# Patient Record
Sex: Male | Born: 1975
Health system: Southern US, Community
[De-identification: ages and names within clinical notes are randomized; demographics above are authoritative.]

## PROBLEM LIST (undated history)

## (undated) DIAGNOSIS — S83519A Sprain of anterior cruciate ligament of unspecified knee, initial encounter: Secondary | ICD-10-CM

## (undated) DIAGNOSIS — Z8701 Personal history of pneumonia (recurrent): Secondary | ICD-10-CM

## (undated) DIAGNOSIS — G4730 Sleep apnea, unspecified: Secondary | ICD-10-CM

## (undated) DIAGNOSIS — M1990 Unspecified osteoarthritis, unspecified site: Secondary | ICD-10-CM

## (undated) DIAGNOSIS — E785 Hyperlipidemia, unspecified: Secondary | ICD-10-CM

## (undated) DIAGNOSIS — I10 Essential (primary) hypertension: Secondary | ICD-10-CM

## (undated) HISTORY — PX: BACK SURGERY: SHX140

## (undated) HISTORY — DX: Hyperlipidemia, unspecified: E78.5

## (undated) HISTORY — PX: NO PAST SURGERIES: SHX2092

---

## 2014-12-25 ENCOUNTER — Ambulatory Visit: Payer: Self-pay | Admitting: Family

## 2014-12-25 VITALS — BP 190/110 | HR 92 | Temp 98.8°F

## 2014-12-25 DIAGNOSIS — I10 Essential (primary) hypertension: Secondary | ICD-10-CM

## 2014-12-25 DIAGNOSIS — R319 Hematuria, unspecified: Secondary | ICD-10-CM

## 2014-12-25 LAB — POCT URINALYSIS DIPSTICK
Glucose, UA: NEGATIVE
Nitrite, UA: NEGATIVE
PH UA: 6
Urobilinogen, UA: 4

## 2014-12-25 MED ORDER — LOSARTAN POTASSIUM 100 MG PO TABS: 100.0000 mg | ORAL_TABLET | Freq: Every day | ORAL | Status: DC

## 2014-12-25 NOTE — Progress Notes (Signed)
S/ new pt . C/o two episodes of painless hematuria one week apart, not associated with trauma , intercourse, or infective sxs. Denies flank pain, abd pain; is circumsised H/o dx HTN did not take med, sounds like could be HCTZ family history + , non smoker Denies cardiac or neuro sxs , looking for PCP and to loose wt ,   O/ alert pleasant NAD Heart rsr lungs clear abd morbidly obese no tenderness appreciated, extremities with  Edema  A/ HTN uncontrolled MORBID OBESITY   intermittant GROSS HEMATURIA PAINLESS    P/  COZARR 100 MG STARTED. IF HE HAS ACUTE SXS HE IS TO SEEK URGENT CARE.  RTC MONDAY FOR FASTING BASELINE LABS. AND BP RECHECK .LIFESTYLE CHANGES ENCOURAGED AND ESTABLISH PCP CARE asap  U/ A SENT. MAY REFER TO UROLOGY .if u/a + will send for U/C and rx . Recheck dip after anbx

## 2014-12-26 ENCOUNTER — Encounter: Payer: Self-pay | Admitting: Family

## 2014-12-26 ENCOUNTER — Other Ambulatory Visit: Payer: Self-pay | Admitting: Family

## 2014-12-26 LAB — URINALYSIS, ROUTINE W REFLEX MICROSCOPIC
BILIRUBIN UA: NEGATIVE
Glucose, UA: NEGATIVE
NITRITE UA: NEGATIVE
PH UA: 6 (ref 5.0–7.5)
Specific Gravity, UA: 1.025 (ref 1.005–1.030)
UUROB: 1 mg/dL (ref 0.2–1.0)

## 2014-12-26 LAB — MICROSCOPIC EXAMINATION: CASTS: NONE SEEN /LPF

## 2014-12-26 MED ORDER — CIPROFLOXACIN HCL 500 MG PO TABS: 500.0000 mg | ORAL_TABLET | Freq: Two times a day (BID) | ORAL | Status: DC

## 2014-12-26 NOTE — Progress Notes (Signed)
Patient ID: Brent Cole, male   DOB: 03/28/1975, 39 y.o.   MRN: [id number]  I spoke to the patient about his lab results and he expressed understanding.  Informed patient that Tommie has sent in a prescription for Cipro and she wants him to come back when he finishes the medication to recheck his urine.  I also referred the patient to [facility] [phone]) [place].  [place] preferred to see a doctor in [place].  His appointment is 02/10/2014 @ 9:30am.

## 2014-12-28 LAB — URINE CULTURE: ORGANISM ID, BACTERIA: NO GROWTH

## 2014-12-28 LAB — SPECIMEN STATUS REPORT

## 2014-12-29 ENCOUNTER — Ambulatory Visit: Payer: Self-pay | Admitting: Physician Assistant

## 2015-01-29 ENCOUNTER — Encounter: Payer: Self-pay | Admitting: Physician Assistant

## 2015-01-29 ENCOUNTER — Ambulatory Visit: Payer: Self-pay | Admitting: Family

## 2015-01-29 VITALS — BP 160/98 | HR 84 | Temp 97.1°F

## 2015-01-29 DIAGNOSIS — I10 Essential (primary) hypertension: Secondary | ICD-10-CM

## 2015-01-29 DIAGNOSIS — G510 Bell's palsy: Secondary | ICD-10-CM

## 2015-01-29 DIAGNOSIS — R319 Hematuria, unspecified: Secondary | ICD-10-CM

## 2015-01-29 MED ORDER — VALACYCLOVIR HCL 1 G PO TABS: 1000.0000 mg | ORAL_TABLET | Freq: Three times a day (TID) | ORAL | Status: DC

## 2015-01-29 MED ORDER — METHYLPREDNISOLONE 4 MG PO TBPK: ORAL_TABLET | ORAL | Status: DC

## 2015-01-29 NOTE — Progress Notes (Signed)
S / one day hx of Weakness of R face , no numbness or tingling, or neuro/cardio sxs .  No recent viral illness or tick bite. Lost to f/u for acute hematuria and  Mod/sev HTN treated and did not RTC for baseline labs, his UC was negative;cipro was started and completed because we could not get up with him to stop after neg UC . Marland Kitchen. He states he has had no further hematuria  . He is taking bP but not established care with PCP due to changes In insurance first of year . He would like an rx for the gym to help with gym fee.   O/ alert bp high but improved  From last reading NAD obvious facial asymetry  Can close eye with intention ,EOMS full, visual fields intact , Neck supple withou bruit heart rsr lungs clear, gait normal , strength and reflexes =   A/ bells palsy  R/o lyme , hiv   P / Baseline labs ,lyme and HIV  RTC tomorrow fasting. Also repeat U/ A dip  Rx s medrol dose pack 4 mg x 6 days with instructions. Valtrex 1,000 mg one tid x 7 days. #21  Recheck bp in am .

## 2015-01-30 ENCOUNTER — Other Ambulatory Visit: Payer: Self-pay

## 2015-01-30 VITALS — BP 160/100

## 2015-01-30 DIAGNOSIS — Z299 Encounter for prophylactic measures, unspecified: Secondary | ICD-10-CM

## 2015-01-30 LAB — POCT URINALYSIS DIPSTICK
Bilirubin, UA: NEGATIVE
GLUCOSE UA: NEGATIVE
KETONES UA: NEGATIVE
LEUKOCYTES UA: NEGATIVE
Nitrite, UA: NEGATIVE
PROTEIN UA: NEGATIVE
SPEC GRAV UA: 1.02
Urobilinogen, UA: 0.2
pH, UA: 5.5

## 2015-01-30 NOTE — Progress Notes (Signed)
Patient came in to have blood drawn per Tommie Ann's order.  Blood was drawn from the right hand by Greig RightSusan Fisher, PA without any incident.

## 2015-01-30 NOTE — Addendum Note (Signed)
Addended by: Catha BrowEACON, Melbert Botelho T on: 01/30/2015 01:35 PM   Modules accepted: Orders

## 2015-02-03 LAB — CMP12+LP+TP+TSH+6AC+PSA+CBC…
A/G RATIO: 1.1 (ref 1.1–2.5)
ALBUMIN: 4.2 g/dL (ref 3.5–5.5)
ALK PHOS: 76 IU/L (ref 39–117)
ALT: 24 IU/L (ref 0–44)
AST: 27 IU/L (ref 0–40)
BASOS ABS: 0 10*3/uL (ref 0.0–0.2)
BILIRUBIN TOTAL: 0.6 mg/dL (ref 0.0–1.2)
BUN / CREAT RATIO: 14 (ref 9–20)
BUN: 13 mg/dL (ref 6–24)
Basos: 0 %
CHLORIDE: 99 mmol/L (ref 96–106)
CHOLESTEROL TOTAL: 182 mg/dL (ref 100–199)
Calcium: 9 mg/dL (ref 8.7–10.2)
Chol/HDL Ratio: 4.7 ratio units (ref 0.0–5.0)
Creatinine, Ser: 0.9 mg/dL (ref 0.76–1.27)
EOS (ABSOLUTE): 0 10*3/uL (ref 0.0–0.4)
EOS: 0 %
Estimated CHD Risk: 0.9 times avg. (ref 0.0–1.0)
FREE THYROXINE INDEX: 1.7 (ref 1.2–4.9)
GFR calc non Af Amer: 106 mL/min/{1.73_m2} (ref >59)
GFR, EST AFRICAN AMERICAN: 122 mL/min/{1.73_m2} (ref >59)
GGT: 23 IU/L (ref 0–65)
GLOBULIN, TOTAL: 3.9 g/dL (ref 1.5–4.5)
Glucose: 89 mg/dL (ref 65–99)
HDL: 39 mg/dL — AB (ref >39)
HEMOGLOBIN: 13.8 g/dL (ref 12.6–17.7)
Hematocrit: 42.4 % (ref 37.5–51.0)
IMMATURE GRANULOCYTES: 0 %
Immature Grans (Abs): 0 10*3/uL (ref 0.0–0.1)
Iron: 114 ug/dL (ref 38–169)
LDH: 249 IU/L — ABNORMAL HIGH (ref 121–224)
LDL CALC: 135 mg/dL — AB (ref 0–99)
Lymphocytes Absolute: 1 10*3/uL (ref 0.7–3.1)
Lymphs: 26 %
MCH: 23.5 pg — ABNORMAL LOW (ref 26.6–33.0)
MCHC: 32.5 g/dL (ref 31.5–35.7)
MCV: 72 fL — ABNORMAL LOW (ref 79–97)
MONOCYTES: 4 %
Monocytes Absolute: 0.2 10*3/uL (ref 0.1–0.9)
NEUTROS PCT: 70 %
Neutrophils Absolute: 2.7 10*3/uL (ref 1.4–7.0)
PLATELETS: 244 10*3/uL (ref 150–379)
PROSTATE SPECIFIC AG, SERUM: 1 ng/mL (ref 0.0–4.0)
Phosphorus: 3.9 mg/dL (ref 2.5–4.5)
Potassium: 4.7 mmol/L (ref 3.5–5.2)
RBC: 5.88 x10E6/uL — AB (ref 4.14–5.80)
RDW: 16 % — AB (ref 12.3–15.4)
Sodium: 138 mmol/L (ref 134–144)
T3 UPTAKE RATIO: 32 % (ref 24–39)
T4, Total: 5.4 ug/dL (ref 4.5–12.0)
TRIGLYCERIDES: 41 mg/dL (ref 0–149)
TSH: 0.519 u[IU]/mL (ref 0.450–4.500)
Total Protein: 8.1 g/dL (ref 6.0–8.5)
Uric Acid: 6.7 mg/dL (ref 3.7–8.6)
VLDL CHOLESTEROL CAL: 8 mg/dL (ref 5–40)
WBC: 3.8 10*3/uL (ref 3.4–10.8)

## 2015-02-03 LAB — HIV ANTIBODY (ROUTINE TESTING W REFLEX): HIV SCREEN 4TH GENERATION: NONREACTIVE

## 2015-02-03 LAB — HGB A1C W/O EAG: HEMOGLOBIN A1C: 6.3 % — AB (ref 4.8–5.6)

## 2015-02-03 LAB — B. BURGDORFI ANTIBODIES: Lyme IgG/IgM Ab: <0.91 {ISR} (ref 0.00–0.90)

## 2015-02-05 ENCOUNTER — Encounter: Payer: Self-pay | Admitting: Emergency Medicine

## 2015-02-05 NOTE — Progress Notes (Signed)
Brent Era BumpersAnn Moore spoke with the patient about his lab results and I mailed a copy to the patient's address per his request.

## 2015-11-05 ENCOUNTER — Encounter (HOSPITAL_COMMUNITY): Payer: Self-pay | Admitting: Emergency Medicine

## 2015-11-05 ENCOUNTER — Ambulatory Visit (HOSPITAL_COMMUNITY)
Admission: EM | Admit: 2015-11-05 | Discharge: 2015-11-05 | Disposition: A | Discharge status: Home / Self Care | Payer: Commercial Managed Care - HMO | Attending: Emergency Medicine | Admitting: Emergency Medicine

## 2015-11-05 DIAGNOSIS — G510 Bell's palsy: Secondary | ICD-10-CM

## 2015-11-05 DIAGNOSIS — I159 Secondary hypertension, unspecified: Secondary | ICD-10-CM | POA: Diagnosis not present

## 2015-11-05 DIAGNOSIS — M5442 Lumbago with sciatica, left side: Secondary | ICD-10-CM

## 2015-11-05 MED ORDER — METAXALONE 800 MG PO TABS: 800.0000 mg | ORAL_TABLET | Freq: Three times a day (TID) | ORAL | 0 refills | Status: DC

## 2015-11-05 MED ORDER — METHYLPREDNISOLONE 4 MG PO TBPK: ORAL_TABLET | ORAL | 0 refills | Status: DC

## 2015-11-05 MED ORDER — HYDROCODONE-ACETAMINOPHEN 5-325 MG PO TABS: 2.0000 | ORAL_TABLET | ORAL | 0 refills | Status: DC | PRN

## 2015-11-05 MED ORDER — DICLOFENAC SODIUM 75 MG PO TBEC: 75.0000 mg | DELAYED_RELEASE_TABLET | Freq: Two times a day (BID) | ORAL | 0 refills | Status: DC

## 2015-11-05 MED ORDER — LOSARTAN POTASSIUM 25 MG PO TABS: 25.0000 mg | ORAL_TABLET | Freq: Every day | ORAL | 0 refills | Status: DC

## 2015-11-05 NOTE — ED Triage Notes (Signed)
Patient reports having low back pain that radiates into bilateral legs, particularly the left thigh.  Denies bowel or bladder issues
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
## 2015-11-05 NOTE — Discharge Instructions (Signed)
Decrease your salt intake. diet and exercise will lower your blood pressure significantly. It is important to keep your blood pressure under good control, as having a elevated for prolonged periods of time significantly increases your risk of stroke, heart attacks, kidney damage, eye damage, and other problems. Keep a log of your blood pressure. Measure it once a day at the same time every day.  Return immediately to the ER if you start having chest pain, headache, problems seeing, problems talking, problems walking, if you feel like you're about to pass out, if you do pass out, if you have a seizure, or for any other concerns..Marland Kitchen

## 2015-11-05 NOTE — ED Notes (Signed)
This nurse not notified of abnormal vital signs 

## 2015-11-06 ENCOUNTER — Telehealth (HOSPITAL_COMMUNITY): Payer: Self-pay | Admitting: Emergency Medicine

## 2015-11-06 MED ORDER — LOSARTAN POTASSIUM 50 MG PO TABS
50.0000 mg | ORAL_TABLET | Freq: Every day | ORAL | 0 refills | Status: DC
Start: 2015-11-06 — End: 2016-01-13

## 2015-11-06 NOTE — Telephone Encounter (Signed)
-----   Message from Domenick GongAshley Mortenson, MD sent at 11/06/2015  9:01 AM EDT ----- Martina SinnerHi, Blu Lori. I started this patient on 25 mg of losartan once daily last night. The correct dose is actually 50 mg by mouth once daily. Can please call the patient and let him know that he can increase his losartan up to 50 mg once daily. If he needs a new prescription, authorizing losartan with  50 mg, dispense 30, no refills, generic okay to the pharmacy of his choice. Please remind him to keep a log of his blood pressure. Thanks  Morrie SheldonAshley

## 2015-11-06 NOTE — Telephone Encounter (Signed)
Called pt and notified of new change to Rx given yest  States he has not p/u his Rx yet but will p/u later on this morning..... Adv pt that he will need to double up on his 25 mg dose to equal 50 mg and adv him that his new Rx will be for 50 mg 1 tab QD  Also adv pt to keep daily log of BP readings and to f/u PCP... If sx get worse, he is to [facility]  Pt verb understanding and wants me to call in Rx [facility]

## 2015-11-17 ENCOUNTER — Other Ambulatory Visit: Payer: Self-pay | Admitting: Internal Medicine

## 2015-11-17 DIAGNOSIS — M545 Low back pain, unspecified: Secondary | ICD-10-CM

## 2015-11-24 ENCOUNTER — Ambulatory Visit
Admission: RE | Admit: 2015-11-24 | Discharge: 2015-11-24 | Disposition: A | Discharge status: Home / Self Care | Payer: Commercial Managed Care - HMO | Source: Ambulatory Visit | Attending: Internal Medicine | Admitting: Internal Medicine

## 2015-11-24 DIAGNOSIS — M545 Low back pain, unspecified: Secondary | ICD-10-CM

## 2016-01-05 ENCOUNTER — Other Ambulatory Visit: Payer: Self-pay | Admitting: Neurosurgery

## 2016-01-08 HISTORY — PX: OTHER SURGICAL HISTORY: SHX169

## 2016-01-13 NOTE — Pre-Procedure Instructions (Signed)
Brent Cole  01/13/2016      [facility] [phone] [place] - [address] AT [facility] [address] [facility] [address] [place] [address] Phone: [phone] Fax: [phone]  [facility] [id number] [place] - [address] AT [facility] OF [address] [place] [id number] Phone: [phone] Fax: [phone]    Your procedure is scheduled on Tuesday, December 12th, 2017.  Report to [facility] Admitting at 5:30 A.M.   Call this number if you have problems the morning of surgery:  [phone]   Remember:  Do not eat food or drink liquids after midnight.   Take these medicines the morning of surgery with A SIP OF WATER: Acetaminophen (Tylenol) if needed, Hydrocodone-acetaminophen (Norco/Vicodin) if needed  Stop taking: Diclofenac (Voltaren), Aspirin, NSAIDS, Aleve, Naproxen, Ibuprofen, Advil, Motrin, BC's, Goody's, Fish oil, all herbal medications, and all vitamins.    Do not wear jewelry.  Do not wear lotions, powders, or colognes, or deoderant.  Men may shave face and neck.  Do not bring valuables to the hospital.  [facility] is not responsible for any belongings or valuables.  Contacts, dentures or bridgework may not be worn into surgery.  Leave your suitcase in the car.  After surgery it may be brought to your room.  For patients admitted to the hospital, discharge time will be determined by your treatment team.  Patients discharged the day of surgery will not be allowed to drive home.   Special instructions:  Preparing for Surgery.   [place]- Preparing For Surgery  Before surgery, you can play an important role. Because skin is not sterile, your skin needs to be as free of germs as possible. You can reduce the number of germs on your skin by washing with CHG (chlorahexidine gluconate) Soap before surgery.  CHG is an antiseptic cleaner which kills germs and bonds  with the skin to continue killing germs even after washing.  Please do not use if you have an allergy to CHG or antibacterial soaps. If your skin becomes reddened/irritated stop using the CHG.  Do not shave (including legs and underarms) for at least 48 hours prior to first CHG shower. It is OK to shave your face.  Please follow these instructions carefully.   1. Shower the NIGHT BEFORE SURGERY and the MORNING OF SURGERY with CHG.   2. If you chose to wash your hair, wash your hair first as usual with your normal shampoo.  3. After you shampoo, rinse your hair and body thoroughly to remove the shampoo.  4. Use CHG as you would any other liquid soap. You can apply CHG directly to the skin and wash gently with a scrungie or a clean washcloth.   5. Apply the CHG Soap to your body ONLY FROM THE NECK DOWN.  Do not use on open wounds or open sores. Avoid contact with your eyes, ears, mouth and genitals (private parts). Wash genitals (private parts) with your normal soap.  6. Wash thoroughly, paying special attention to the area where your surgery will be performed.  7. Thoroughly rinse your body with warm water from the neck down.  8. DO NOT shower/wash with your normal soap after using and rinsing off the CHG Soap.  9. Pat yourself dry with a CLEAN TOWEL.   10. Wear CLEAN PAJAMAS   11. Place CLEAN SHEETS on your bed the night of  your first shower and DO NOT SLEEP WITH PETS.   Day of Surgery: Do not apply any deodorants/lotions. Please wear clean clothes to the hospital/surgery center.     Please read over the following fact sheets that you were given. MRSA Information

## 2016-01-14 ENCOUNTER — Encounter (HOSPITAL_COMMUNITY)
Admission: RE | Admit: 2016-01-14 | Discharge: 2016-01-14 | Disposition: A | Discharge status: Home / Self Care | Payer: Commercial Managed Care - HMO | Source: Ambulatory Visit | Attending: Neurosurgery | Admitting: Neurosurgery

## 2016-01-14 ENCOUNTER — Encounter (HOSPITAL_COMMUNITY): Payer: Self-pay

## 2016-01-14 DIAGNOSIS — M1990 Unspecified osteoarthritis, unspecified site: Secondary | ICD-10-CM | POA: Insufficient documentation

## 2016-01-14 DIAGNOSIS — Z01810 Encounter for preprocedural cardiovascular examination: Secondary | ICD-10-CM | POA: Diagnosis present

## 2016-01-14 DIAGNOSIS — G4733 Obstructive sleep apnea (adult) (pediatric): Secondary | ICD-10-CM | POA: Diagnosis not present

## 2016-01-14 DIAGNOSIS — Z01812 Encounter for preprocedural laboratory examination: Secondary | ICD-10-CM | POA: Insufficient documentation

## 2016-01-14 DIAGNOSIS — I10 Essential (primary) hypertension: Secondary | ICD-10-CM | POA: Insufficient documentation

## 2016-01-14 DIAGNOSIS — Z6841 Body Mass Index (BMI) 40.0 and over, adult: Secondary | ICD-10-CM | POA: Insufficient documentation

## 2016-01-14 DIAGNOSIS — Z79899 Other long term (current) drug therapy: Secondary | ICD-10-CM | POA: Diagnosis not present

## 2016-01-14 HISTORY — DX: Essential (primary) hypertension: I10

## 2016-01-14 HISTORY — DX: Sprain of anterior cruciate ligament of unspecified knee, initial encounter: S83.519A

## 2016-01-14 HISTORY — DX: Unspecified osteoarthritis, unspecified site: M19.90

## 2016-01-14 HISTORY — DX: Personal history of pneumonia (recurrent): Z87.01

## 2016-01-14 HISTORY — DX: Sleep apnea, unspecified: G47.30

## 2016-01-14 LAB — BASIC METABOLIC PANEL
ANION GAP: 10 (ref 5–15)
BUN: 9 mg/dL (ref 6–20)
CALCIUM: 8.6 mg/dL — AB (ref 8.9–10.3)
CO2: 25 mmol/L (ref 22–32)
CREATININE: 1.18 mg/dL (ref 0.61–1.24)
Chloride: 98 mmol/L — ABNORMAL LOW (ref 101–111)
GFR calc Af Amer: >60 mL/min (ref >60)
GLUCOSE: 133 mg/dL — AB (ref 65–99)
Potassium: 3.6 mmol/L (ref 3.5–5.1)
Sodium: 133 mmol/L — ABNORMAL LOW (ref 135–145)

## 2016-01-14 LAB — ABO/RH: ABO/RH(D): B POS

## 2016-01-14 LAB — CBC
HCT: 41.6 % (ref 39.0–52.0)
Hemoglobin: 13.4 g/dL (ref 13.0–17.0)
MCH: 23.5 pg — ABNORMAL LOW (ref 26.0–34.0)
MCHC: 32.2 g/dL (ref 30.0–36.0)
MCV: 72.9 fL — AB (ref 78.0–100.0)
PLATELETS: 208 10*3/uL (ref 150–400)
RBC: 5.71 MIL/uL (ref 4.22–5.81)
RDW: 14.9 % (ref 11.5–15.5)
WBC: 4.3 10*3/uL (ref 4.0–10.5)

## 2016-01-14 LAB — TYPE AND SCREEN
ABO/RH(D): B POS
Antibody Screen: NEGATIVE

## 2016-01-14 LAB — SURGICAL PCR SCREEN
MRSA, PCR: NEGATIVE
STAPHYLOCOCCUS AUREUS: NEGATIVE

## 2016-01-14 NOTE — Progress Notes (Signed)
Patient called to inform nurse that he was being seen at Dr. Idelle CrouchPolite's office today for elevated blood pressure.

## 2016-01-14 NOTE — Progress Notes (Signed)
   01/14/16 1018  OBSTRUCTIVE SLEEP APNEA  Have you ever been diagnosed with sleep apnea through a sleep study? Yes (15 years ago had a study)  If yes, do you have and use a CPAP or BPAP machine every night? 0  Do you snore loudly (loud enough to be heard through closed doors)?  1  Do you often feel tired, fatigued, or sleepy during the daytime (such as falling asleep during driving or talking to someone)? 0  Has anyone observed you stop breathing during your sleep? 1  Do you have, or are you being treated for high blood pressure? 1  BMI more than 35 kg/m2? 1  Age > 50 (1-yes) 0  Neck circumference greater than:Male 16 inches or larger, Male 17inches or larger? 1  Male Gender (Yes=1) 1  Obstructive Sleep Apnea Score 6

## 2016-01-15 MED ORDER — DEXTROSE 5 % IV SOLN
3.0000 g | INTRAVENOUS | Status: AC
  Administered 2016-01-18 (×2): 3 g via INTRAVENOUS
  Filled 2016-01-15: qty 3000

## 2016-01-15 NOTE — Progress Notes (Signed)
Anesthesia Chart Review: Patient is a 40 year old male scheduled for L3-L5 laminectomy with L4-5 discectomy/fusion on 01/18/2016 by Dr. Jeral FruitBotero. Cased was moved up from 01/19/16 with Dr. Jeral FruitBotero to now 01/18/16 to Dr. Danielle DessElsner.   History includes non-smoker, HTN, arthritis, OSA (no CPAP). BMI (66) is consistent with super morbid obesity.   PCP is Dr. Renford Dillsonald Polite at [facility].  BP (!) 169/104   Pulse 83   Temp 36.4 C (Oral)   Resp 20   Ht 6' (1.829 m)   Wt (!) 485 lb 0.2 oz (220 kg)   SpO2 99%   BMI 65.78 kg/m  BP on arrival to PAT was 202/94, recheck 169/104. (Patient reported he had taken Hyzaar.)  Meds include Norco, losartan-HCTZ.   01/14/16 EKG: NSR.  Preoperative labs noted. Cr 1.18, H/H 13.4/41.6. T&S done.   Patient was notified that surgery could be cancelled if he arrived on the day of surgery with a significantly elevated BP. He notified Dr. Idelle CrouchPolite's office and was seen on 01/14/16 for HTN follow-up. Notes requested and will follow-up once received. I notified Jessica at Dr. Cassandria SanteeBotero's office of patient's elevated BP at PAT and that patient was seen yesterday [facility]. Hopefully additional antihypertensives were added.   Velna Ochsllison Taija Mathias, PA-C [facility] Short Stay Center/Anesthesiology Phone [phone] 01/15/2016 10:31 AM  Addendum: 01/14/16 office note from Dr. Georgann HousekeeperKarrar Husain received. BP 160/100 at that visit. Amlodipine 5 mg daily added. He was to have a nurse recheck his BP on 01/18/16, but now surgery is moved up. I called and spoke with him about taking amlodipine on the morning of surgery with sips of water. Hyzaar is a medication that we typically hold on the morning of surgery. His anesthesiologist can decide whether or not for him to take losartan-HCTZ after his arrival if his BP is still elevated. He is aware to arrive by 5:30 AM on Monday. Proceeding as planned will depend on BP readings and exam findings on the day of surgery.  Velna Ochsllison Kortlyn Koltz, PA-C [facility] Short Stay  Center/Anesthesiology Phone [phone] 01/15/2016 5:31 PM

## 2016-01-17 ENCOUNTER — Encounter (HOSPITAL_COMMUNITY): Payer: Self-pay | Admitting: Anesthesiology

## 2016-01-17 NOTE — Anesthesia Preprocedure Evaluation (Addendum)
Anesthesia Evaluation  Patient identified by MRN, date of birth, ID band Patient awake    Reviewed: Allergy & Precautions, NPO status , Patient's Chart, lab work & pertinent test results  Airway Mallampati: I  TM Distance: >3 FB Neck ROM: Full    Dental  (+) Teeth Intact, Dental Advisory Given   Pulmonary sleep apnea ,    breath sounds clear to auscultation       Cardiovascular hypertension, Pt. on medications  Rhythm:Regular Rate:Normal     Neuro/Psych negative neurological ROS  negative psych ROS   GI/Hepatic negative GI ROS, Neg liver ROS,   Endo/Other  negative endocrine ROS  Renal/GU negative Renal ROS  negative genitourinary   Musculoskeletal  (+) Arthritis , Osteoarthritis,    Abdominal   Peds  Hematology negative hematology ROS (+)   Anesthesia Other Findings   Reproductive/Obstetrics negative OB ROS                            Lab Results  Component Value Date   WBC 4.3 01/14/2016   HGB 13.4 01/14/2016   HCT 41.6 01/14/2016   MCV 72.9 (L) 01/14/2016   PLT 208 01/14/2016   Lab Results  Component Value Date   CREATININE 1.18 01/14/2016   BUN 9 01/14/2016   NA 133 (L) 01/14/2016   K 3.6 01/14/2016   CL 98 (L) 01/14/2016   CO2 25 01/14/2016   EKG: NSR  Anesthesia Physical Anesthesia Plan  ASA: III  Anesthesia Plan: General   Post-op Pain Management:    Induction: Intravenous  Airway Management Planned: Oral ETT  Additional Equipment:   Intra-op Plan:   Post-operative Plan: Extubation in OR  Informed Consent: I have reviewed the patients History and Physical, chart, labs and discussed the procedure including the risks, benefits and alternatives for the proposed anesthesia with the patient or authorized representative who has indicated his/her understanding and acceptance.   Dental advisory given  Plan Discussed with: CRNA  Anesthesia Plan Comments:         Anesthesia Quick Evaluation

## 2016-01-18 ENCOUNTER — Inpatient Hospital Stay (HOSPITAL_COMMUNITY): Payer: Commercial Managed Care - HMO

## 2016-01-18 ENCOUNTER — Inpatient Hospital Stay (HOSPITAL_COMMUNITY): Payer: Commercial Managed Care - HMO | Admitting: Anesthesiology

## 2016-01-18 ENCOUNTER — Encounter (HOSPITAL_COMMUNITY)
Admission: RE | Disposition: A | Discharge status: Home Health | Payer: Self-pay | Source: Ambulatory Visit | Attending: Neurological Surgery

## 2016-01-18 ENCOUNTER — Inpatient Hospital Stay (HOSPITAL_COMMUNITY)
Admission: RE | Admit: 2016-01-18 | Discharge: 2016-01-28 | DRG: 454 | Disposition: A | Discharge status: Home Health | Payer: Commercial Managed Care - HMO | Source: Ambulatory Visit | Attending: Neurological Surgery | Admitting: Neurological Surgery

## 2016-01-18 ENCOUNTER — Encounter (HOSPITAL_COMMUNITY): Payer: Self-pay

## 2016-01-18 ENCOUNTER — Inpatient Hospital Stay (HOSPITAL_COMMUNITY): Payer: Commercial Managed Care - HMO | Admitting: Vascular Surgery

## 2016-01-18 DIAGNOSIS — R112 Nausea with vomiting, unspecified: Secondary | ICD-10-CM

## 2016-01-18 DIAGNOSIS — M5116 Intervertebral disc disorders with radiculopathy, lumbar region: Secondary | ICD-10-CM | POA: Diagnosis present

## 2016-01-18 DIAGNOSIS — R739 Hyperglycemia, unspecified: Secondary | ICD-10-CM | POA: Diagnosis not present

## 2016-01-18 DIAGNOSIS — G4733 Obstructive sleep apnea (adult) (pediatric): Secondary | ICD-10-CM | POA: Diagnosis present

## 2016-01-18 DIAGNOSIS — M1990 Unspecified osteoarthritis, unspecified site: Secondary | ICD-10-CM | POA: Diagnosis present

## 2016-01-18 DIAGNOSIS — M48062 Spinal stenosis, lumbar region with neurogenic claudication: Principal | ICD-10-CM | POA: Diagnosis present

## 2016-01-18 DIAGNOSIS — I10 Essential (primary) hypertension: Secondary | ICD-10-CM | POA: Diagnosis present

## 2016-01-18 DIAGNOSIS — D62 Acute posthemorrhagic anemia: Secondary | ICD-10-CM | POA: Diagnosis not present

## 2016-01-18 DIAGNOSIS — R109 Unspecified abdominal pain: Secondary | ICD-10-CM

## 2016-01-18 DIAGNOSIS — N179 Acute kidney failure, unspecified: Secondary | ICD-10-CM | POA: Diagnosis not present

## 2016-01-18 DIAGNOSIS — K567 Ileus, unspecified: Secondary | ICD-10-CM | POA: Diagnosis not present

## 2016-01-18 DIAGNOSIS — Z8249 Family history of ischemic heart disease and other diseases of the circulatory system: Secondary | ICD-10-CM

## 2016-01-18 DIAGNOSIS — Z6841 Body Mass Index (BMI) 40.0 and over, adult: Secondary | ICD-10-CM

## 2016-01-18 DIAGNOSIS — M5117 Intervertebral disc disorders with radiculopathy, lumbosacral region: Secondary | ICD-10-CM | POA: Diagnosis present

## 2016-01-18 DIAGNOSIS — E662 Morbid (severe) obesity with alveolar hypoventilation: Secondary | ICD-10-CM | POA: Diagnosis present

## 2016-01-18 DIAGNOSIS — I959 Hypotension, unspecified: Secondary | ICD-10-CM

## 2016-01-18 DIAGNOSIS — T465X5A Adverse effect of other antihypertensive drugs, initial encounter: Secondary | ICD-10-CM | POA: Diagnosis not present

## 2016-01-18 DIAGNOSIS — Y92239 Unspecified place in hospital as the place of occurrence of the external cause: Secondary | ICD-10-CM | POA: Diagnosis not present

## 2016-01-18 DIAGNOSIS — T502X5A Adverse effect of carbonic-anhydrase inhibitors, benzothiadiazides and other diuretics, initial encounter: Secondary | ICD-10-CM | POA: Diagnosis not present

## 2016-01-18 DIAGNOSIS — Z9889 Other specified postprocedural states: Secondary | ICD-10-CM

## 2016-01-18 DIAGNOSIS — M4316 Spondylolisthesis, lumbar region: Secondary | ICD-10-CM | POA: Diagnosis present

## 2016-01-18 DIAGNOSIS — Z419 Encounter for procedure for purposes other than remedying health state, unspecified: Secondary | ICD-10-CM

## 2016-01-18 DIAGNOSIS — I9589 Other hypotension: Secondary | ICD-10-CM | POA: Diagnosis not present

## 2016-01-18 SURGERY — POSTERIOR LUMBAR FUSION 1 LEVEL
Anesthesia: General | Site: Back

## 2016-01-18 MED ORDER — HYDROMORPHONE HCL 1 MG/ML IJ SOLN
INTRAMUSCULAR | Status: AC
  Filled 2016-01-18: qty 1

## 2016-01-18 MED ORDER — SENNA 8.6 MG PO TABS
1.0000 | ORAL_TABLET | Freq: Two times a day (BID) | ORAL | Status: DC
  Administered 2016-01-18 – 2016-01-21 (×4): 8.6 mg via ORAL
  Filled 2016-01-18 (×13): qty 1

## 2016-01-18 MED ORDER — THROMBIN 5000 UNITS EX SOLR
OROMUCOSAL | Status: DC | PRN
  Administered 2016-01-18 (×2): 5 mL via TOPICAL

## 2016-01-18 MED ORDER — PHENOL 1.4 % MT LIQD: 1.0000 | OROMUCOSAL | Status: DC | PRN

## 2016-01-18 MED ORDER — CEFAZOLIN IN D5W 1 GM/50ML IV SOLN
1.0000 g | Freq: Three times a day (TID) | INTRAVENOUS | Status: AC
  Administered 2016-01-18 – 2016-01-19 (×2): 1 g via INTRAVENOUS
  Filled 2016-01-18 (×2): qty 50

## 2016-01-18 MED ORDER — DOCUSATE SODIUM 100 MG PO CAPS
100.0000 mg | ORAL_CAPSULE | Freq: Two times a day (BID) | ORAL | Status: DC
  Administered 2016-01-18 – 2016-01-25 (×11): 100 mg via ORAL
  Filled 2016-01-18 (×15): qty 1

## 2016-01-18 MED ORDER — ONDANSETRON HCL 4 MG/2ML IJ SOLN
INTRAMUSCULAR | Status: DC | PRN
  Administered 2016-01-18: 4 mg via INTRAVENOUS

## 2016-01-18 MED ORDER — SUGAMMADEX SODIUM 500 MG/5ML IV SOLN
INTRAVENOUS | Status: DC | PRN
  Administered 2016-01-18: 500 mg via INTRAVENOUS

## 2016-01-18 MED ORDER — SUCCINYLCHOLINE CHLORIDE 200 MG/10ML IV SOSY
PREFILLED_SYRINGE | INTRAVENOUS | Status: AC
  Filled 2016-01-18: qty 10

## 2016-01-18 MED ORDER — HYDROMORPHONE HCL 1 MG/ML IJ SOLN
0.2500 mg | INTRAMUSCULAR | Status: DC | PRN
  Administered 2016-01-18: 1 mg via INTRAVENOUS

## 2016-01-18 MED ORDER — PHENYLEPHRINE 40 MCG/ML (10ML) SYRINGE FOR IV PUSH (FOR BLOOD PRESSURE SUPPORT)
PREFILLED_SYRINGE | INTRAVENOUS | Status: AC
  Filled 2016-01-18: qty 10

## 2016-01-18 MED ORDER — MENTHOL 3 MG MT LOZG: 1.0000 | LOZENGE | OROMUCOSAL | Status: DC | PRN

## 2016-01-18 MED ORDER — CHLORHEXIDINE GLUCONATE CLOTH 2 % EX PADS: 6.0000 | MEDICATED_PAD | Freq: Once | CUTANEOUS | Status: DC

## 2016-01-18 MED ORDER — AMLODIPINE BESYLATE 5 MG PO TABS
5.0000 mg | ORAL_TABLET | Freq: Every day | ORAL | Status: DC
  Administered 2016-01-19 – 2016-01-25 (×7): 5 mg via ORAL
  Filled 2016-01-18 (×7): qty 1

## 2016-01-18 MED ORDER — LIDOCAINE HCL (CARDIAC) 20 MG/ML IV SOLN
INTRAVENOUS | Status: DC | PRN
  Administered 2016-01-18: 60 mg via INTRAVENOUS

## 2016-01-18 MED ORDER — THROMBIN 5000 UNITS EX SOLR
CUTANEOUS | Status: AC
  Filled 2016-01-18: qty 5000

## 2016-01-18 MED ORDER — ROCURONIUM BROMIDE 50 MG/5ML IV SOSY
PREFILLED_SYRINGE | INTRAVENOUS | Status: AC
  Filled 2016-01-18: qty 5

## 2016-01-18 MED ORDER — DEXAMETHASONE SODIUM PHOSPHATE 10 MG/ML IJ SOLN
INTRAMUSCULAR | Status: DC | PRN
  Administered 2016-01-18: 10 mg via INTRAVENOUS

## 2016-01-18 MED ORDER — MIDAZOLAM HCL 2 MG/2ML IJ SOLN
INTRAMUSCULAR | Status: AC
  Filled 2016-01-18: qty 2

## 2016-01-18 MED ORDER — EPHEDRINE SULFATE 50 MG/ML IJ SOLN
INTRAMUSCULAR | Status: DC | PRN
  Administered 2016-01-18 (×3): 5 mg via INTRAVENOUS

## 2016-01-18 MED ORDER — HYDROCODONE-ACETAMINOPHEN 10-325 MG PO TABS
1.0000 | ORAL_TABLET | Freq: Three times a day (TID) | ORAL | Status: DC | PRN
  Administered 2016-01-19 – 2016-01-25 (×3): 1 via ORAL
  Filled 2016-01-18 (×3): qty 1

## 2016-01-18 MED ORDER — LACTATED RINGERS IV SOLN: INTRAVENOUS | Status: DC

## 2016-01-18 MED ORDER — FENTANYL CITRATE (PF) 100 MCG/2ML IJ SOLN
INTRAMUSCULAR | Status: AC
  Filled 2016-01-18: qty 2

## 2016-01-18 MED ORDER — SODIUM CHLORIDE 0.9 % IV SOLN
INTRAVENOUS | Status: DC
  Administered 2016-01-20 – 2016-01-21 (×2): via INTRAVENOUS

## 2016-01-18 MED ORDER — EPHEDRINE 5 MG/ML INJ
INTRAVENOUS | Status: AC
  Filled 2016-01-18: qty 10

## 2016-01-18 MED ORDER — SODIUM CHLORIDE 0.9% FLUSH
3.0000 mL | Freq: Two times a day (BID) | INTRAVENOUS | Status: DC
  Administered 2016-01-18 – 2016-01-24 (×10): 3 mL via INTRAVENOUS

## 2016-01-18 MED ORDER — SODIUM CHLORIDE 0.9% FLUSH: 3.0000 mL | INTRAVENOUS | Status: DC | PRN

## 2016-01-18 MED ORDER — PROMETHAZINE HCL 25 MG/ML IJ SOLN: 6.2500 mg | INTRAMUSCULAR | Status: DC | PRN

## 2016-01-18 MED ORDER — PROPOFOL 10 MG/ML IV BOLUS
INTRAVENOUS | Status: DC | PRN
  Administered 2016-01-18: 100 mg via INTRAVENOUS
  Administered 2016-01-18: 200 mg via INTRAVENOUS

## 2016-01-18 MED ORDER — LOSARTAN POTASSIUM-HCTZ 100-25 MG PO TABS: 1.0000 | ORAL_TABLET | Freq: Every day | ORAL | Status: DC

## 2016-01-18 MED ORDER — PROPOFOL 10 MG/ML IV BOLUS
INTRAVENOUS | Status: AC
  Filled 2016-01-18: qty 40

## 2016-01-18 MED ORDER — ACETAMINOPHEN 325 MG PO TABS: 650.0000 mg | ORAL_TABLET | ORAL | Status: DC | PRN

## 2016-01-18 MED ORDER — LOSARTAN POTASSIUM 50 MG PO TABS
100.0000 mg | ORAL_TABLET | Freq: Every day | ORAL | Status: DC
  Administered 2016-01-18 – 2016-01-25 (×8): 100 mg via ORAL
  Filled 2016-01-18 (×8): qty 2

## 2016-01-18 MED ORDER — 0.9 % SODIUM CHLORIDE (POUR BTL) OPTIME
TOPICAL | Status: DC | PRN
  Administered 2016-01-18: 1000 mL

## 2016-01-18 MED ORDER — ALUM & MAG HYDROXIDE-SIMETH 200-200-20 MG/5ML PO SUSP
30.0000 mL | Freq: Four times a day (QID) | ORAL | Status: DC | PRN
Start: 2016-01-18 — End: 2016-01-28
  Administered 2016-01-20: 30 mL via ORAL
  Filled 2016-01-18 (×2): qty 30

## 2016-01-18 MED ORDER — LACTATED RINGERS IV SOLN
INTRAVENOUS | Status: DC | PRN
  Administered 2016-01-18 (×3): via INTRAVENOUS

## 2016-01-18 MED ORDER — BACITRACIN 50000 UNITS IM SOLR
INTRAMUSCULAR | Status: DC | PRN
  Administered 2016-01-18: 500 mL

## 2016-01-18 MED ORDER — MEPERIDINE HCL 25 MG/ML IJ SOLN: 6.2500 mg | INTRAMUSCULAR | Status: DC | PRN

## 2016-01-18 MED ORDER — PHENYLEPHRINE HCL 10 MG/ML IJ SOLN
INTRAMUSCULAR | Status: DC | PRN
  Administered 2016-01-18: 80 ug via INTRAVENOUS
  Administered 2016-01-18 (×2): 120 ug via INTRAVENOUS

## 2016-01-18 MED ORDER — LIDOCAINE-EPINEPHRINE (PF) 2 %-1:200000 IJ SOLN
INTRAMUSCULAR | Status: AC
  Filled 2016-01-18: qty 20

## 2016-01-18 MED ORDER — ONDANSETRON HCL 4 MG/2ML IJ SOLN
4.0000 mg | INTRAMUSCULAR | Status: DC | PRN
  Administered 2016-01-20 (×2): 4 mg via INTRAVENOUS
  Filled 2016-01-18 (×2): qty 2

## 2016-01-18 MED ORDER — ROCURONIUM BROMIDE 100 MG/10ML IV SOLN
INTRAVENOUS | Status: DC | PRN
  Administered 2016-01-18 (×2): 10 mg via INTRAVENOUS
  Administered 2016-01-18: 50 mg via INTRAVENOUS
  Administered 2016-01-18: 30 mg via INTRAVENOUS
  Administered 2016-01-18: 10 mg via INTRAVENOUS

## 2016-01-18 MED ORDER — OXYCODONE-ACETAMINOPHEN 5-325 MG PO TABS
1.0000 | ORAL_TABLET | ORAL | Status: DC | PRN
  Administered 2016-01-19 – 2016-01-27 (×5): 2 via ORAL
  Filled 2016-01-18 (×5): qty 2

## 2016-01-18 MED ORDER — PHENYLEPHRINE HCL 10 MG/ML IJ SOLN
INTRAVENOUS | Status: DC | PRN
  Administered 2016-01-18: 30 ug/min via INTRAVENOUS

## 2016-01-18 MED ORDER — THROMBIN 20000 UNITS EX SOLR
CUTANEOUS | Status: AC
  Filled 2016-01-18: qty 20000

## 2016-01-18 MED ORDER — THROMBIN 20000 UNITS EX SOLR
CUTANEOUS | Status: DC | PRN
  Administered 2016-01-18: 20 mL via TOPICAL

## 2016-01-18 MED ORDER — FENTANYL CITRATE (PF) 100 MCG/2ML IJ SOLN
INTRAMUSCULAR | Status: DC | PRN
  Administered 2016-01-18 (×3): 50 ug via INTRAVENOUS
  Administered 2016-01-18 (×2): 25 ug via INTRAVENOUS
  Administered 2016-01-18: 100 ug via INTRAVENOUS
  Administered 2016-01-18 (×2): 50 ug via INTRAVENOUS

## 2016-01-18 MED ORDER — BUPIVACAINE HCL (PF) 0.5 % IJ SOLN
INTRAMUSCULAR | Status: DC | PRN
  Administered 2016-01-18: 30 mL

## 2016-01-18 MED ORDER — METHOCARBAMOL 500 MG PO TABS
500.0000 mg | ORAL_TABLET | Freq: Four times a day (QID) | ORAL | Status: DC | PRN
  Administered 2016-01-19 – 2016-01-27 (×3): 500 mg via ORAL
  Filled 2016-01-18 (×3): qty 1

## 2016-01-18 MED ORDER — POLYETHYLENE GLYCOL 3350 17 G PO PACK: 17.0000 g | PACK | Freq: Every day | ORAL | Status: DC | PRN

## 2016-01-18 MED ORDER — BUPIVACAINE HCL (PF) 0.5 % IJ SOLN
INTRAMUSCULAR | Status: AC
  Filled 2016-01-18: qty 30

## 2016-01-18 MED ORDER — SUCCINYLCHOLINE CHLORIDE 20 MG/ML IJ SOLN
INTRAMUSCULAR | Status: DC | PRN
  Administered 2016-01-18: 130 mg via INTRAVENOUS

## 2016-01-18 MED ORDER — HYDROCHLOROTHIAZIDE 25 MG PO TABS
25.0000 mg | ORAL_TABLET | Freq: Every day | ORAL | Status: DC
  Administered 2016-01-18 – 2016-01-25 (×7): 25 mg via ORAL
  Filled 2016-01-18 (×8): qty 1

## 2016-01-18 MED ORDER — ALBUMIN HUMAN 5 % IV SOLN
INTRAVENOUS | Status: DC | PRN
  Administered 2016-01-18 (×2): via INTRAVENOUS

## 2016-01-18 MED ORDER — ACETAMINOPHEN 650 MG RE SUPP: 650.0000 mg | RECTAL | Status: DC | PRN

## 2016-01-18 MED ORDER — METHOCARBAMOL 1000 MG/10ML IJ SOLN
500.0000 mg | Freq: Four times a day (QID) | INTRAVENOUS | Status: DC | PRN
  Filled 2016-01-18: qty 5

## 2016-01-18 MED ORDER — HYDROMORPHONE HCL 1 MG/ML IJ SOLN
0.5000 mg | INTRAMUSCULAR | Status: DC | PRN
  Administered 2016-01-19 (×2): 1 mg via INTRAVENOUS
  Filled 2016-01-18 (×2): qty 1

## 2016-01-18 MED ORDER — LIDOCAINE-EPINEPHRINE (PF) 2 %-1:200000 IJ SOLN: INTRAMUSCULAR | Status: DC | PRN

## 2016-01-18 MED ORDER — ONDANSETRON HCL 4 MG/2ML IJ SOLN
INTRAMUSCULAR | Status: AC
  Filled 2016-01-18: qty 2

## 2016-01-18 MED ORDER — DEXTROSE 5 % IV SOLN
3.0000 g | INTRAVENOUS | Status: DC
  Filled 2016-01-18: qty 3000

## 2016-01-18 MED ORDER — SODIUM CHLORIDE 0.9 % IV SOLN: 250.0000 mL | INTRAVENOUS | Status: DC

## 2016-01-18 MED ORDER — BISACODYL 10 MG RE SUPP: 10.0000 mg | Freq: Every day | RECTAL | Status: DC | PRN

## 2016-01-18 MED ORDER — MIDAZOLAM HCL 5 MG/5ML IJ SOLN
INTRAMUSCULAR | Status: DC | PRN
  Administered 2016-01-18: 2 mg via INTRAVENOUS

## 2016-01-18 MED ORDER — LIDOCAINE 2% (20 MG/ML) 5 ML SYRINGE
INTRAMUSCULAR | Status: AC
  Filled 2016-01-18: qty 5

## 2016-01-18 SURGICAL SUPPLY — 75 items
BAG DECANTER FOR FLEXI CONT (MISCELLANEOUS) ×2 IMPLANT
BASKET BONE COLLECTION (BASKET) ×2 IMPLANT
BLADE CLIPPER SURG (BLADE) IMPLANT
BUR MATCHSTICK NEURO 3.0 LAGG (BURR) ×2 IMPLANT
CAGE COROENT LRG 8X9X28M SPINE (Cage) ×4 IMPLANT
CAGE COROENT LRG MP 8X9X28-12 (Cage) ×4 IMPLANT
CANISTER SUCT 3000ML PPV (MISCELLANEOUS) ×2 IMPLANT
CARTRIDGE OIL MAESTRO DRILL (MISCELLANEOUS) ×1 IMPLANT
CONNECTOR RELINE 45-65 5.5 (Connector) ×2 IMPLANT
CONT SPEC 4OZ CLIKSEAL STRL BL (MISCELLANEOUS) ×2 IMPLANT
COVER BACK TABLE 60X90IN (DRAPES) ×2 IMPLANT
DECANTER SPIKE VIAL GLASS SM (MISCELLANEOUS) ×2 IMPLANT
DERMABOND ADVANCED (GAUZE/BANDAGES/DRESSINGS) ×1
DERMABOND ADVANCED .7 DNX12 (GAUZE/BANDAGES/DRESSINGS) ×1 IMPLANT
DEVICE DISSECT PLASMABLAD 3.0S (MISCELLANEOUS) ×1 IMPLANT
DIFFUSER DRILL AIR PNEUMATIC (MISCELLANEOUS) ×2 IMPLANT
DRAPE C-ARM 42X72 X-RAY (DRAPES) ×6 IMPLANT
DRAPE HALF SHEET 40X57 (DRAPES) IMPLANT
DRAPE LAPAROTOMY 100X72X124 (DRAPES) ×2 IMPLANT
DRAPE POUCH INSTRU U-SHP 10X18 (DRAPES) ×2 IMPLANT
DRSG OPSITE POSTOP 4X10 (GAUZE/BANDAGES/DRESSINGS) ×2 IMPLANT
DURAPREP 26ML APPLICATOR (WOUND CARE) ×2 IMPLANT
DURASEAL APPLICATOR TIP (TIP) IMPLANT
DURASEAL SPINE SEALANT 3ML (MISCELLANEOUS) IMPLANT
ELECT REM PT RETURN 9FT ADLT (ELECTROSURGICAL) ×2
ELECTRODE REM PT RTRN 9FT ADLT (ELECTROSURGICAL) ×1 IMPLANT
GAUZE SPONGE 4X4 12PLY STRL (GAUZE/BANDAGES/DRESSINGS) ×2 IMPLANT
GAUZE SPONGE 4X4 16PLY XRAY LF (GAUZE/BANDAGES/DRESSINGS) IMPLANT
GLOVE BIO SURGEON STRL SZ 6.5 (GLOVE) ×10 IMPLANT
GLOVE BIOGEL M 8.0 STRL (GLOVE) ×2 IMPLANT
GLOVE BIOGEL PI IND STRL 6.5 (GLOVE) ×4 IMPLANT
GLOVE BIOGEL PI IND STRL 7.5 (GLOVE) ×2 IMPLANT
GLOVE BIOGEL PI IND STRL 8.5 (GLOVE) ×3 IMPLANT
GLOVE BIOGEL PI INDICATOR 6.5 (GLOVE) ×4
GLOVE BIOGEL PI INDICATOR 7.5 (GLOVE) ×2
GLOVE BIOGEL PI INDICATOR 8.5 (GLOVE) ×3
GLOVE ECLIPSE 7.5 STRL STRAW (GLOVE) ×4 IMPLANT
GLOVE ECLIPSE 8.5 STRL (GLOVE) ×8 IMPLANT
GLOVE ECLIPSE 9.0 STRL (GLOVE) ×2 IMPLANT
GOWN STRL REUS W/ TWL LRG LVL3 (GOWN DISPOSABLE) ×3 IMPLANT
GOWN STRL REUS W/ TWL XL LVL3 (GOWN DISPOSABLE) ×2 IMPLANT
GOWN STRL REUS W/TWL 2XL LVL3 (GOWN DISPOSABLE) ×4 IMPLANT
GOWN STRL REUS W/TWL LRG LVL3 (GOWN DISPOSABLE) ×3
GOWN STRL REUS W/TWL XL LVL3 (GOWN DISPOSABLE) ×2
HEMOSTAT POWDER KIT SURGIFOAM (HEMOSTASIS) ×2 IMPLANT
KIT BASIN OR (CUSTOM PROCEDURE TRAY) ×2 IMPLANT
KIT ROOM TURNOVER OR (KITS) ×2 IMPLANT
MODULE POWER NUVASIVE (MISCELLANEOUS) ×1 IMPLANT
NEEDLE HYPO 18GX1.5 BLUNT FILL (NEEDLE) ×2 IMPLANT
NEEDLE HYPO 22GX1.5 SAFETY (NEEDLE) ×2 IMPLANT
NS IRRIG 1000ML POUR BTL (IV SOLUTION) ×2 IMPLANT
OIL CARTRIDGE MAESTRO DRILL (MISCELLANEOUS) ×2
PACK LAMINECTOMY NEURO (CUSTOM PROCEDURE TRAY) ×2 IMPLANT
PAD ARMBOARD 7.5X6 YLW CONV (MISCELLANEOUS) ×6 IMPLANT
PATTIES SURGICAL .5 X1 (DISPOSABLE) ×2 IMPLANT
PATTIES SURGICAL 1X1 (DISPOSABLE) ×2 IMPLANT
PLASMABLADE 3.0S (MISCELLANEOUS) ×2
POWER MODULE NUVASIVE (MISCELLANEOUS) ×2
ROD RELINE TI LORD 5.5X70 (Rod) ×4 IMPLANT
SCREW LOCK RELINE 5.5 TULIP (Screw) ×12 IMPLANT
SCREW RELINE-O POLY 6.5X50MM (Screw) ×12 IMPLANT
SPONGE LAP 4X18 X RAY DECT (DISPOSABLE) ×6 IMPLANT
SPONGE NEURO XRAY DETECT 1X3 (DISPOSABLE) ×2 IMPLANT
SPONGE SURGIFOAM ABS GEL 100 (HEMOSTASIS) ×2 IMPLANT
SUT PROLENE 6 0 BV (SUTURE) IMPLANT
SUT VIC AB 1 CT1 18XBRD ANBCTR (SUTURE) ×1 IMPLANT
SUT VIC AB 1 CT1 8-18 (SUTURE) ×1
SUT VIC AB 2-0 CP2 18 (SUTURE) ×2 IMPLANT
SUT VIC AB 3-0 SH 8-18 (SUTURE) ×6 IMPLANT
SYR 3ML LL SCALE MARK (SYRINGE) ×8 IMPLANT
SYR 5ML LL (SYRINGE) ×2 IMPLANT
TOWEL OR 17X24 6PK STRL BLUE (TOWEL DISPOSABLE) ×2 IMPLANT
TOWEL OR 17X26 10 PK STRL BLUE (TOWEL DISPOSABLE) ×2 IMPLANT
TRAY FOLEY W/METER SILVER 16FR (SET/KITS/TRAYS/PACK) ×2 IMPLANT
WATER STERILE IRR 1000ML POUR (IV SOLUTION) ×2 IMPLANT

## 2016-01-18 NOTE — Anesthesia Postprocedure Evaluation (Signed)
Anesthesia Post Note  Patient: Brent Cole  Procedure(s) Performed: Procedure(s) (LRB): LUMBAR FOUR  TO SACRUM ONE LAMINECTOMY WITH LUMBAR FOUR - SACRUM ONE DISCECTOMY /FUSION WITH PEDICLE SCREWS (N/A)  Patient location during evaluation: PACU Anesthesia Type: General Level of consciousness: awake and alert Pain management: pain level controlled Vital Signs Assessment: post-procedure vital signs reviewed and stable Respiratory status: spontaneous breathing, nonlabored ventilation, respiratory function stable and patient connected to nasal cannula oxygen Cardiovascular status: blood pressure returned to baseline and stable Postop Assessment: no signs of nausea or vomiting Anesthetic complications: no    Last Vitals:  Vitals:   01/18/16 1700 01/18/16 1800  BP: (!) 150/100 (!) 150/92  Pulse: 96 87  Resp: 16 17  Temp:      Last Pain:  Vitals:   01/18/16 1700  TempSrc:   PainSc: 3     LLE Motor Response: Purposeful movement (01/18/16 1800) LLE Sensation: Full sensation (01/18/16 1800) RLE Motor Response: Purposeful movement (01/18/16 1800) RLE Sensation: Full sensation (01/18/16 1800)      Shelton SilvasKevin D Leandre Wien

## 2016-01-18 NOTE — Op Note (Signed)
Date of surgery: 01/18/2016 Preoperative diagnosis: Spondylolisthesis L4-L5 with herniated nucleus pulposus L4-5 L5-S1 lumbar radiculopathy lumbar stenosis. Postoperative diagnosis: Spondylolisthesis L4-L5 with herniated nucleus pulposus L4-5 and L5-S1 lumbar radiculopathy, lumbar stenosis. Procedure: Bilateral laminectomy decompression L4-5 L5-S1 posterior lumbar interbody arthrodesis L4-5 L5-S1 with segmental fixation L4 to sacrum. Local autograft. Posterior lateral arthrodesis with local autograft L4 to sacrum. Surgeon: Barnett AbuHenry Franchesca Veneziano First assistant: Lelon PerlaHenry Poole M.D. Anesthesia: Gen. endotracheal Indications: Brent Cooperntoine Cole 40 year old individual's had significant back and bilateral lower extremity pain and weakness has evidence of large centrally herniated disc at L4-L5 with grade 1 spondylolisthesis at that level addition he has a centrally herniated nucleus pulposus at L5-S1 with severe lumbar spinal stenosis. Is advised regarding the need for surgery to decompress and stabilize his spine from L4 to the sacrum.  Procedure: The patient was brought to the operating room supine on a stretcher. After the smooth induction of general endotracheal anesthesia, he was carefully turned prone onto an operative table with a large bolsters being used to provide padding for this morbidly obese individual. The back was prepped with alcohol DuraPrep and draped in a sterile fashion then a midline incision was created in the lower lumbar spine carried down to the lumbar dorsal fascia. The fascia was opened on either side of midline in the first spinous process was noted to be that of L4 isolated positively on the radiograph. Then the dissection was carried in the subperiosteal fashion between L4-5 L5-S1 to expose the lamina and the facet joints at L4-5 and L5-S1. Self-retaining retractor was placed deep into the wound and ultimately the soft tissues were dissected from the interlaminar space at L4-5 and L5-S1 and the  transverse process of L4 was identified out to the facet inferiorly at L3-L4. Then laminotomies were created removing the inferior margin lamina of L4 out to the medial wall the facet. Arch facetectomy was performed and superiorly the L4 nerve root was well decompressed. Inferiorly the L5 nerve root was isolated and decompressed with a 2 and 3 mm Kerrison punch in addition to a small high-speed drill. Care was taken to protect the nerve roots all throughout. Laminectomy of L5 was then completed removing the entire laminar arch and complete facet joints at the L5-S1 level this allowed for good decompression of the canal centrally also exposure of the S1 nerve roots inferiorly and a decompression of these nerve roots was undertaken using a 2 and a 3 mm Kerrison punch and a high-speed drill. Once the central and lateral recesses were well decompressed the nerve roots were isolated discectomies were then performed at L5-S1 bilaterally and laterally easing a series of curettes rongeurs and shavers to remove all the cartilaginous attachments to the endplate at L5 and S1. The interspace was sized and is felt that a 12-lead degree lordotic 8 mm tall spacer measuring 20 mm in length would fit best into the L5-S1 space. This was packed with autograft and a total of 9 mL of autograft was placed into the interspace along with the 2 cages. Attention was then turned to L4-L5 or similar total discectomy was performed here there was large subligamentous discectomy with herniation causing severe compromise to the central canal and lateral recesses. This was removed. The endplates were again shaved smooth and decorticated and ultimately an 8 lordotic 8 mm tall 20 mm long spacer would fit best into this interspace. Autograft for total volume of 6 mL of autograft was packed into the interspace along with a 2 cages. Then the  intertransverse spaces were isolated and decorticated from L4 down to the sacrum sacral alar was exposed. Once  this was completed the remainder of the autograft was packed into this or recess on the left and the right for a total volume of 6 mL on each side. Attention was then turned to placement of pedicle screws lateral fluoroscopy did not allow visualization of the vertebrae adequately therefore AP fluoroscopy was used to place screws in L4-L5 and the sacrum 6.5 x 50 mm screws were used in each locale. Then a neutral construct precontoured rods were used to connect the screw heads together. These were tightened down and torqued to the prescribed measure. A transverse connector was placed between the screws. Then hemostasis was checked in the wound and care was taken to make sure that the L4 the L5 and the S1 nerve roots were well decompressed. Once this was verified the lumbar dorsal fascia was closed with #1 Vicryl in interrupted fashion 2-0 Vicryl was used in the subcutaneous anus tissues and 3-0 Vicryl to close subcutaneous take her skin. Dermabond was placed on the skin along with a honeycomb dressing. Blood loss was estimated at 1500 mL and a total of 500 mL of Cell Saver blood was returned to the patient.

## 2016-01-18 NOTE — Progress Notes (Signed)
Dr. Hart RochesterHollis informed of BP elevation, no new orders.

## 2016-01-18 NOTE — H&P (Signed)
HISTORY OF PRESENT ILLNESS:                     [name] is a gentleman who comes today complaining of back pain with radiation to both legs up to the point that he had an incident where he was unable to get out of the bed.  He has been out of work for at least six weeks.  He recalled having some problems in the past.  He is quite miserable.  He had the pain, he had the weakness, but so far his bladder is okay, although once in a while he has some problem with his bladder.  He has no urinary incontinence whatsoever.  He had an MRI and was sent to us for evaluation.  REVIEW OF SYSTEMS:                                    Review of systems positive for swelling of the feet and legs, leg weakness, back pain.  PAST MEDICAL HISTORY:                                  . Prior Operations:  He never had surgery.  . Medications and Allergies:  He is not allergic to any medication.  He takes losartan for high blood pressure, also hydrocodone once in a while for pain.  FAMILY HISTORY:                                            Unremarkable.  SOCIAL HISTORY:                                            The patient does not smoke.  He drinks socially.  PHYSICAL EXAMINATION:                                The patient came to my office.  He has some difficulty walking and sitting.  Marland Kitchen. HEENT - normocephalic, atraumatic.  The extraocular muscles are intact.  Sclerae - white.  Conjunctiva - pink.  Oropharynx benign.  Uvula midline.   . Neck - there are no masses, meningismus, deformities, tracheal deviation, jugular vein distention or carotid bruits.  There is normal cervical range of motion.  Spurlings' test is negative without reproducible radicular pain turning the patient's head to either side.  Lhermitte's sign is not present with axial compression.    Marland Kitchen. Respiratory - Lungs:  There is some mild rhonchi bilaterally.  . Cardiovascular - the heart has regular rate and rhythm to auscultation.  No murmurs are  appreciated.  There is no extremity edema, cyanosis or clubbing.  There are palpable pedal pulses.    . Abdomen - soft, nontender, no hepatosplenomegaly appreciated or masses.  There are active bowel sounds.  No guarding or rebound.    . Extremities - Grade 1 edema.  . Musculoskeletal Examination - straight leg raising was quite difficult to do but it was positive at about 60 degrees.  I was not able to elicit femoral stretch  maneuver.  NEUROLOGICAL EXAMINATION:                       He is able to give me the details of his problem without any problem.  . Cranial Nerve Examination - Cranial nerves seem to be normal.  . Motor Examination - Strength:  He has some weakness of dorsiflexion in both feet.  The right one about 3/5 to 4/5; the left one 2/5 to 3/5.  He has difficulty lying in bed.     . Sensory Examination - Sensation normal.  . Deep Tendon Reflexes - Reflexes are 1+.  IMAGING STUDIES:                                          Lumbar spine x-ray.  It is difficult because of poor penetration but there is a step-off, spondylolisthesis between 4 and 5.  The MRI is quite impressive.  He has a herniated disc with spondylolisthesis and stenosis at the level of 4-5 and also a herniated disc at the level of 5-1.  CLINICAL IMPRESSION:                                                Lumbar stenosis and neurogenic claudication, spondylolisthesis, herniated disc with radiculopathy.  Morbid obesity.    RECOMMENDATIONS:   The patient has been seen and evaluated by Dr. Jeral FruitBotero. He has had about 2 mo of conservative therapy during which time he feels considerable worsening of his symptoms. He is not maintainig any level of comfort. Despite some major comorbididty specifically morbid obesity, he is am appropriate candidate for surgery and is now admitted for a 2 level decompression and fusion of L4 to sacrum with a posterior interbody technique.

## 2016-01-18 NOTE — Anesthesia Procedure Notes (Addendum)
Procedure Name: Intubation Date/Time: 01/18/2016 7:58 AM Performed by: Adonis HousekeeperNGELL, Argus Caraher M Pre-anesthesia Checklist: Patient identified, Emergency Drugs available, Suction available and Patient being monitored Patient Re-evaluated:Patient Re-evaluated prior to inductionOxygen Delivery Method: Circle system utilized Preoxygenation: Pre-oxygenation with 100% oxygen Intubation Type: IV induction Ventilation: Mask ventilation without difficulty Laryngoscope Size: Mac and 4 Grade View: Grade II Tube type: Oral Tube size: 7.5 mm Number of attempts: 1 Airway Equipment and Method: Stylet Placement Confirmation: ETT inserted through vocal cords under direct vision,  positive ETCO2 and breath sounds checked- equal and bilateral Secured at: 23 cm Tube secured with: Tape Dental Injury: Teeth and Oropharynx as per pre-operative assessment

## 2016-01-18 NOTE — Progress Notes (Signed)
Patient ID: Brent Cole, male   DOB: [birth year], 40 y.o.   MRN: [id number] Vital signs are stable. Patient is reasonably comfortable. Moves both lower extremities well. Dressing is dry.

## 2016-01-18 NOTE — Brief Op Note (Signed)
Portable spine reult verified and called by Dr Kearney Hardover Confirmed to be @ L4-L5

## 2016-01-18 NOTE — Transfer of Care (Signed)
Immediate Anesthesia Transfer of Care Note  Patient: Brent Cole  Procedure(s) Performed: Procedure(s): LUMBAR FOUR  TO SACRUM ONE LAMINECTOMY WITH LUMBAR FOUR - SACRUM ONE DISCECTOMY /FUSION WITH PEDICLE SCREWS (N/A)  Patient Location: PACU  Anesthesia Type:General  Level of Consciousness: awake, alert , oriented and patient cooperative  Airway & Oxygen Therapy: Patient Spontanous Breathing and Patient connected to face mask oxygen  Post-op Assessment: Report given to RN, Post -op Vital signs reviewed and stable and Patient moving all extremities X 4  Post vital signs: Reviewed and stable  Last Vitals:  Vitals:   01/18/16 0626  BP: (!) 166/108  Pulse: (!) 103  Temp: 36.9 C    Last Pain:  Vitals:   01/18/16 0626  TempSrc: Oral         Complications: No apparent anesthesia complications

## 2016-01-19 LAB — BASIC METABOLIC PANEL
Anion gap: 8 (ref 5–15)
BUN: 16 mg/dL (ref 6–20)
CHLORIDE: 100 mmol/L — AB (ref 101–111)
CO2: 28 mmol/L (ref 22–32)
CREATININE: 1.45 mg/dL — AB (ref 0.61–1.24)
Calcium: 8.2 mg/dL — ABNORMAL LOW (ref 8.9–10.3)
GFR calc non Af Amer: 58 mL/min — ABNORMAL LOW (ref >60)
Glucose, Bld: 144 mg/dL — ABNORMAL HIGH (ref 65–99)
Potassium: 4 mmol/L (ref 3.5–5.1)
Sodium: 136 mmol/L (ref 135–145)

## 2016-01-19 LAB — CBC
HEMATOCRIT: 35.1 % — AB (ref 39.0–52.0)
HEMOGLOBIN: 11 g/dL — AB (ref 13.0–17.0)
MCH: 23.2 pg — AB (ref 26.0–34.0)
MCHC: 31.3 g/dL (ref 30.0–36.0)
MCV: 74.1 fL — ABNORMAL LOW (ref 78.0–100.0)
Platelets: 175 10*3/uL (ref 150–400)
RBC: 4.74 MIL/uL (ref 4.22–5.81)
RDW: 15.2 % (ref 11.5–15.5)
WBC: 9 10*3/uL (ref 4.0–10.5)

## 2016-01-19 MED FILL — Thrombin For Soln 5000 Unit: CUTANEOUS | Qty: 5000 | Status: AC

## 2016-01-19 MED FILL — Heparin Sodium (Porcine) Inj 1000 Unit/ML: INTRAMUSCULAR | Qty: 30 | Status: AC

## 2016-01-19 MED FILL — Sodium Chloride IV Soln 0.9%: INTRAVENOUS | Qty: 2000 | Status: AC

## 2016-01-19 NOTE — Progress Notes (Signed)
Orthopedic Tech Progress Note Patient Details:  Brent Destinentuan Oros 03-Dec-1975 [id number]  Patient ID: Brent Cole, male   DOB: 03-Dec-1975, 40 y.o.   MRN: [id number]   [name] 01/19/2016, 10:19 [facility] [facility] Lumbar [facility].

## 2016-01-19 NOTE — Progress Notes (Signed)
Patient ID: Brent Cole, male   DOB: 07-26-1975, 40 y.o.   MRN: [id number] Vital signs are stable Postoperative midlobe and is 11 Patient had one episode of hypotension when standing He is rather large and requires substantial help with localization We'll keep him in ICU overnight Reattempt mobilization tomorrow

## 2016-01-19 NOTE — Evaluation (Addendum)
Occupational Therapy Evaluation Patient Details Name: Brent Cole MRN: [id number] DOB: November 07, 1975 Today's Date: 01/19/2016    History of Present Illness Patient is a 40 y/o male with hx of HTN, ACL tear presents s/p L4-S1 laminectomy, discectomy with fusion.   Clinical Impression   Patient is s/p L4-S1 laminectomy discectomy with fusion surgery resulting in functional limitations due to the deficits listed below (see OT problem list). PTA was living alone and independent with all adls. Pt reports x2 months not working due to severe back pain.  Patient will benefit from skilled OT acutely to increase independence and safety with ADLS to allow discharge HHOT.  Pt could benefit from bariatric size chair and 3n1 in the room this admission to help with progression toward goals.      Follow Up Recommendations  Home health OT    Equipment Recommendations  Other (comment) (TBA and will need bariatric)    Recommendations for Other Services       Precautions / Restrictions Precautions Precautions: Back Precaution Booklet Issued: Yes (comment) Precaution Comments: reviewed back precautions. Required Braces or Orthoses: Spinal Brace Spinal Brace: Lumbar corset (.) Restrictions Weight Bearing Restrictions: No      Mobility Bed Mobility Overal bed mobility: Needs Assistance Bed Mobility: Rolling;Sidelying to Sit;Sit to Sidelying Rolling: Min assist Sidelying to sit: Max assist;+2 for physical assistance;HOB elevated     Sit to sidelying: Max assist;+2 for physical assistance General bed mobility comments: Cues for log roll technique. Assist to elevate trunk and to roll onto left side. + dizziness. Assist to bring LES into bed and lower trunk due to episode of hypotension and diaphoresis.  Transfers Overall transfer level: Needs assistance Equipment used:  (back of recliner chair) Transfers: Sit to/from Stand Sit to Stand: Mod assist;From elevated surface          General transfer comment: Cues to use body momentum to stand using chair to pull up on. pt anxious. + dizziness.     Balance Overall balance assessment: Needs assistance Sitting-balance support: Feet supported;Bilateral upper extremity supported Sitting balance-Leahy Scale: Fair Sitting balance - Comments: Able to sit with UE support. Sat EOB ~20 minutes, able to scoot to EOB with cues and no LOB.   Standing balance support: During functional activity Standing balance-Leahy Scale: Poor Standing balance comment: Reliant on UEs for support on back of recliner chair.                            ADL Overall ADL's : Needs assistance/impaired Eating/Feeding: Set up;Bed level       Upper Body Bathing: Maximal assistance   Lower Body Bathing: Total assistance   Upper Body Dressing : Maximal assistance   Lower Body Dressing: Total assistance   Toilet Transfer: +2 for physical assistance;Maximal assistance Toilet Transfer Details (indicate cue type and reason): simulated eob with bed elevated           General ADL Comments: pt completed supine to sit EOB and return to supine due to orthostatic. pt anxious about mobility initially and needed x3 attempts to gather courage to stand. pt currently high fall risk.    Back handout provided and reviewed adls in detail. Pt educated on: c avoid sitting for long periods of time, correct bed positioning for sleeping, correct sequence for bed mobility, avoiding lifting more than 5 pounds and never wash directly over incision. No brace currently present. Pending arrival    Vision  Perception     Praxis      Pertinent Vitals/Pain Pain Assessment: 0-10 Pain Score: 5  Pain Location: back Pain Descriptors / Indicators: Sore;Operative site guarding Pain Intervention(s): Monitored during session;Premedicated before session;Repositioned     Hand Dominance     Extremity/Trunk Assessment Upper Extremity Assessment Upper  Extremity Assessment: Overall WFL for tasks assessed   Lower Extremity Assessment Lower Extremity Assessment: Defer to PT evaluation   Cervical / Trunk Assessment Cervical / Trunk Assessment: Other exceptions Cervical / Trunk Exceptions: s/p spine surgery   Communication Communication Communication: No difficulties   Cognition Arousal/Alertness: Awake/alert Behavior During Therapy: WFL for tasks assessed/performed Overall Cognitive Status: Within Functional Limits for tasks assessed                     General Comments       Exercises       Shoulder Instructions      Home Living Family/patient expects to be discharged to:: Private residence Living Arrangements: Alone Available Help at Discharge: Family;Friend(s) (available if needed) Type of Home: Apartment Home Access: Stairs to enter Entrance Stairs-Number of Steps: 1 flight Entrance Stairs-Rails: Right Home Layout: One level     Bathroom Shower/Tub: Chief Strategy OfficerTub/shower unit   Bathroom Toilet: Standard     Home Equipment: None          Prior Functioning/Environment Level of Independence: Independent        Comments: Drives. Has not been working for 2 months.        OT Problem List: Decreased strength;Decreased range of motion;Decreased activity tolerance;Impaired balance (sitting and/or standing);Decreased safety awareness;Decreased knowledge of use of DME or AE;Decreased knowledge of precautions;Cardiopulmonary status limiting activity;Obesity;Pain   OT Treatment/Interventions: Self-care/ADL training;Therapeutic exercise;DME and/or AE instruction;Energy conservation;Therapeutic activities;Patient/family education;Balance training    OT Goals(Current goals can be found in the care plan section) Acute Rehab OT Goals Patient Stated Goal: to get back to life OT Goal Formulation: With patient Time For Goal Achievement: 02/02/16 Potential to Achieve Goals: Good  OT Frequency: Min 3X/week   Barriers to  D/C: Decreased caregiver support  will be home alone except best friend visiting for few days and sibliing can give PRN (A)       Co-evaluation PT/OT/SLP Co-Evaluation/Treatment: Yes Reason for Co-Treatment: Complexity of the patient's impairments (multi-system involvement);Necessary to address cognition/behavior during functional activity;To address functional/ADL transfers;For patient/therapist safety   OT goals addressed during session: ADL's and self-care;Strengthening/ROM      End of Session Equipment Utilized During Treatment:  (pending brace arrival) Nurse Communication: Mobility status;Precautions  Activity Tolerance: Other (comment) (BP limiting) Patient left: in bed;with call bell/phone within reach;with bed alarm set;with nursing/sitter in room;with SCD's reapplied   Time: [id number] OT Time Calculation (min): 42 min Charges:  OT General Charges $OT Visit: 1 Procedure OT Evaluation $OT Eval High Complexity: 1 Procedure G-Codes:    Brent Cole, Brent Cole 01/19/2016, 3:12 PM   Brent Cole, Brent Cole   OTR/L Pager: [phone] Office: [phone] .

## 2016-01-19 NOTE — Care Management Note (Signed)
Case Management Note  Patient Details  Name: Brent Cole MRN: [id number] Date of Birth: [birth year]  Subjective/Objective:  Pt admitted on 01/18/16 s/p L4-S1 laminectomy, discectomy with fusion.  PTA, pt independent and living alone.                    Action/Plan: PT/OT recommending HH follow up.  Will follow for MD orders for home health care prior to dc.     Expected Discharge Date:                  Expected Discharge Plan:  Home w Home Health Services  In-House Referral:     Discharge planning Services  CM Consult  Post Acute Care Choice:    Choice offered to:     DME Arranged:    DME Agency:     HH Arranged:    HH Agency:     Status of Service:  In process, will continue to follow  If discussed at Long Length of Stay Meetings, dates discussed:    Additional Comments:  Quintella BatonJulie W. Amanda Steuart, RN, BSN  Trauma/Neuro ICU Case Manager [phone]

## 2016-01-19 NOTE — Evaluation (Addendum)
Physical Therapy Evaluation Patient Details Name: Brent Cole MRN: [id number] DOB: 11/11/1975 Today's Date: 01/19/2016   History of Present Illness  Patient is a 40 y/o male with hx of HTN, ACL tear presents s/p L4-S1 laminectomy, discectomy with fusion.  Clinical Impression  Patient presents with pain, post surgical deficits, and hypotension s/p above impacting mobility. Tolerated standing with UE support but transfer to chair limited due to episode of hypotension, diaphoresis and dizziness. Education re: back precautions, positioning, log roll technique etc. Pt lives alone and independent PTA. Reports he can get help at d/c. Will follow acutely to maximize independence and mobility and improve OOB tolerance prior to return home.     Follow Up Recommendations Home health PT;Supervision for mobility/OOB    Equipment Recommendations  Other (comment) (may have RW he can borrow from [place])    Recommendations for Other Services       Precautions / Restrictions Precautions Precautions: Back; watch BP Precaution Booklet Issued: Yes (comment) Precaution Comments: reviewed back precautions. Required Braces or Orthoses: Spinal Brace Spinal Brace: Lumbar corset (.) Restrictions Weight Bearing Restrictions: No      Mobility  Bed Mobility Overal bed mobility: Needs Assistance Bed Mobility: Rolling;Sidelying to Sit;Sit to Sidelying Rolling: Min assist Sidelying to sit: Max assist;+2 for physical assistance;HOB elevated     Sit to sidelying: Max assist;+2 for physical assistance General bed mobility comments: Cues for log roll technique. Assist to elevate trunk and to roll onto left side. + dizziness. Assist to bring LES into bed and lower trunk due to episode of hypotension and diaphoresis.  Transfers Overall transfer level: Needs assistance Equipment used:  (back of recliner chair) Transfers: Sit to/from Stand Sit to Stand: Mod assist;From elevated surface         General  transfer comment: Cues to use body momentum to stand using chair to pull up on. pt anxious. + dizziness.   Ambulation/Gait Ambulation/Gait assistance:  (Able to march in place, needed to sit due to dizziness.)              Stairs            Wheelchair Mobility    Modified Rankin (Stroke Patients Only)       Balance Overall balance assessment: Needs assistance Sitting-balance support: Feet supported;Bilateral upper extremity supported Sitting balance-Leahy Scale: Fair Sitting balance - Comments: Able to sit with UE support. Sat EOB ~20 minutes, able to scoot to EOB with cues and no LOB.   Standing balance support: During functional activity Standing balance-Leahy Scale: Poor Standing balance comment: Reliant on UEs for support on back of recliner chair.                             Pertinent Vitals/Pain Pain Assessment: 0-10 Pain Score: 5  Pain Location: back Pain Descriptors / Indicators: Sore;Operative site guarding Pain Intervention(s): Monitored during session;Repositioned    Home Living Family/patient expects to be discharged to:: Private residence Living Arrangements: Alone Available Help at Discharge: Family;Friend(s) (available if needed) Type of Home: Apartment Home Access: Stairs to enter Entrance Stairs-Rails: Right Entrance Stairs-Number of Steps: 1 flight Home Layout: One level Home Equipment: None      Prior Function Level of Independence: Independent         Comments: Drives. Has not been working for 2 months.     Hand Dominance        Extremity/Trunk Assessment   Upper Extremity Assessment: Defer to  OT evaluation           Lower Extremity Assessment: Generalized weakness (No numbness or tingling BLEs.)      Cervical / Trunk Assessment: Other exceptions  Communication   Communication: No difficulties  Cognition Arousal/Alertness: Awake/alert Behavior During Therapy: WFL for tasks assessed/performed Overall  Cognitive Status: Within Functional Limits for tasks assessed                      General Comments General comments (skin integrity, edema, etc.): Pt with + dizziness, hypotension, diaphoresis after standing trial resulting in need to returnt o supine. RN present.    Exercises     Assessment/Plan    PT Assessment Patient needs continued PT services  PT Problem List Decreased strength;Decreased mobility;Obesity;Decreased knowledge of precautions;Decreased balance;Decreased activity tolerance;Pain;Decreased knowledge of use of DME          PT Treatment Interventions DME instruction;Therapeutic activities;Gait training;Therapeutic exercise;Patient/family education;Balance training;Functional mobility training;Stair training    PT Goals (Current goals can be found in the Care Plan section)  Acute Rehab PT Goals Patient Stated Goal: to get back to life PT Goal Formulation: With patient Time For Goal Achievement: 02/02/16 Potential to Achieve Goals: Good    Frequency Min 5X/week   Barriers to discharge Decreased caregiver support;Inaccessible home environment flight of steps to get into apt; lives alone    Co-evaluation               End of Session   Activity Tolerance: Treatment limited secondary to medical complications (Comment) (hypotensive episode) Patient left: in bed;with call bell/phone within reach;with nursing/sitter in room;with SCD's reapplied Nurse Communication: Mobility status         Time: 1610-96040924-1006 PT Time Calculation (min) (ACUTE ONLY): 42 min   Charges:   PT Evaluation $PT Eval Moderate Complexity: 1 Procedure PT Treatments $Therapeutic Activity: 8-22 mins   PT G Codes:        Kiah Keay A Abdulaziz Toman 01/19/2016, 10:26 AM  Mylo RedShauna Nakai Pollio, PT, DPT [phone]

## 2016-01-20 ENCOUNTER — Inpatient Hospital Stay (HOSPITAL_COMMUNITY): Payer: Commercial Managed Care - HMO

## 2016-01-20 MED ORDER — LABETALOL HCL 5 MG/ML IV SOLN
10.0000 mg | INTRAVENOUS | Status: DC | PRN
  Administered 2016-01-20 (×2): 10 mg via INTRAVENOUS
  Filled 2016-01-20 (×2): qty 4

## 2016-01-20 MED ORDER — DEXAMETHASONE SODIUM PHOSPHATE 4 MG/ML IJ SOLN
4.0000 mg | Freq: Once | INTRAMUSCULAR | Status: AC
  Administered 2016-01-20: 4 mg via INTRAVENOUS
  Filled 2016-01-20: qty 1

## 2016-01-20 MED ORDER — PROMETHAZINE HCL 25 MG/ML IJ SOLN
12.5000 mg | Freq: Four times a day (QID) | INTRAMUSCULAR | Status: DC | PRN
  Administered 2016-01-20: 12.5 mg via INTRAVENOUS
  Filled 2016-01-20: qty 1

## 2016-01-20 NOTE — Progress Notes (Signed)
Occupational Therapy Treatment Patient Details Name: Brent Cole MRN: [id number] DOB: 09-24-1975 Today's Date: 01/20/2016    History of present illness Patient is a 40 y/o male with hx of HTN, ACL tear presents s/p L4-S1 laminectomy, discectomy with fusion.   OT comments  Pt could benefit from a bariatric lift 1000 maxisky room for transfer out of unit. Pt needs bariatric bedside commode and recliner. Pt demonstrates decr BP requiring ace wraps and SCD. Pt could benefit from ted hose for ambulation to prevent decr BP. Pt was able to complete sit<>stand x2 this session and take several steps forward and backward. Pt describes R LE shooting pain in sitting but resolves with standing.    Follow Up Recommendations  Home health OT    Equipment Recommendations  Other (comment) (bariatric)    Recommendations for Other Services      Precautions / Restrictions Precautions Precautions: Back Precaution Booklet Issued: No Precaution Comments: Able to recall 3/3 back precautions Required Braces or Orthoses: Spinal Brace Spinal Brace: Lumbar corset Restrictions Weight Bearing Restrictions: No       Mobility Bed Mobility Overal bed mobility: Needs Assistance Bed Mobility: Rolling;Sidelying to Sit;Sit to Sidelying Rolling: Min assist Sidelying to sit: Max assist;+2 for physical assistance;HOB elevated     Sit to sidelying: Max assist;+2 for physical assistance General bed mobility comments: Cues for log roll technique. Assist to elevate trunk and to roll onto left side. Assist to bring LEs into bed and lower trunk.  Transfers Overall transfer level: Needs assistance Equipment used:  (back of recliner chair) Transfers: Sit to/from Stand Sit to Stand: Mod assist;From elevated surface         General transfer comment: Cues to use body momentum to stand using chair to pull up on. + dizziness. Stood from [employer] EnergyEOB x2.     Balance Overall balance assessment: Needs  assistance Sitting-balance support: Feet supported;Bilateral upper extremity supported Sitting balance-Leahy Scale: Fair Sitting balance - Comments: bil UE sitting eob with R LE pain in sitting.    Standing balance support: During functional activity Standing balance-Leahy Scale: Poor Standing balance comment: Reliant on UEs for support on back of recliner chair.                   ADL Overall ADL's : Needs assistance/impaired Eating/Feeding: Set up;Bed level   Grooming: Wash/dry hands;Set up                                 General ADL Comments: pt provided ace wraps bil LE and SCD to (A) with BP during transfers. See vitals for values. Pt demonstrates decr in BP with all mobility but was able to progress with therapist today      Vision                     Perception     Praxis      Cognition   Behavior During Therapy: [facility] for tasks assessed/performed Overall Cognitive Status: Within Functional Limits for tasks assessed                       Extremity/Trunk Assessment               Exercises     Shoulder Instructions       General Comments      Pertinent Vitals/ Pain       Pain Assessment: 0-10  Pain Score: 5  Pain Location: back Pain Descriptors / Indicators: Sore;Operative site guarding;Guarding;Grimacing Pain Intervention(s): Monitored during session;Premedicated before session;Repositioned;Patient requesting pain meds-RN notified;RN gave pain meds during session;Limited activity within patient's tolerance  Home Living                                          Prior Functioning/Environment              Frequency  Min 3X/week        Progress Toward Goals  OT Goals(current goals can now be found in the care plan section)  Progress towards OT goals: Progressing toward goals  Acute Rehab OT Goals Patient Stated Goal: to get back to life OT Goal Formulation: With patient Time For Goal  Achievement: 02/02/16 Potential to Achieve Goals: Good ADL Goals Pt Will Perform Grooming: with supervision;sitting Pt Will Perform Upper Body Bathing: with supervision;sitting Pt Will Transfer to Toilet: with supervision;bedside commode;ambulating Additional ADL Goal #1: Pt will complete bed mobility mod I as precursor to adls.   Plan Discharge plan remains appropriate    Co-evaluation    PT/OT/SLP Co-Evaluation/Treatment: Yes Reason for Co-Treatment: Complexity of the patient's impairments (multi-system involvement);For patient/therapist safety;To address functional/ADL transfers PT goals addressed during session: Mobility/safety with mobility;Strengthening/ROM OT goals addressed during session: ADL's and self-care;Strengthening/ROM      End of Session Equipment Utilized During Treatment: Other (comment) (recliner backward for support)   Activity Tolerance Patient tolerated treatment well   Patient Left in bed;with call bell/phone within reach;with bed alarm set;with SCD's reapplied   Nurse Communication Mobility status;Precautions        Time: [id number] OT Time Calculation (min): 43 min  Charges: OT General Charges $OT Visit: 1 Procedure OT Treatments $Therapeutic Activity: 23-37 mins  Boone MasterJones, [place] Doig B 01/20/2016, 12:08 PM   Mateo FlowJones, Brynn   OTR/L Pager: [phone] Office: [phone] .

## 2016-01-20 NOTE — Progress Notes (Signed)
Physical Therapy Treatment Patient Details Name: Brent Cole MRN: [id number] DOB: 1975-12-19 Today's Date: 01/20/2016    History of Present Illness Patient is a 40 y/o male with hx of HTN, ACL tear presents s/p L4-S1 laminectomy, discectomy with fusion.    PT Comments    Prior to treatment, wrapped BLEs with ace wraps and SCDs to improve BP tolerance to activity. See BP below. Supine BP 158/70 Sitting BP 145/50 Standing BP 127/83 Supine post activity BP 114/68 Pt reports dizziness improved from yesterday but still doesn't feel quite right. Reports new shooting pain down RLE while sitting EOB which is relieved by standing. Able to perform gait training today in room holding into back of recliner. Recommend bariatric recliner and BSC. Will attempt gait training outside of room next session as tolerated. Encouraged OOB to chair with RN later in day as appropriate. Will follow.   Follow Up Recommendations  Home health PT;Supervision for mobility/OOB     Equipment Recommendations  Other (comment)    Recommendations for Other Services       Precautions / Restrictions Precautions Precautions: Back Precaution Booklet Issued: No Precaution Comments: Able to recall 3/3 back precautions Required Braces or Orthoses: Spinal Brace Spinal Brace: Lumbar corset Restrictions Weight Bearing Restrictions: No    Mobility  Bed Mobility Overal bed mobility: Needs Assistance Bed Mobility: Rolling;Sidelying to Sit;Sit to Sidelying Rolling: Min assist Sidelying to sit: Max assist;+2 for physical assistance;HOB elevated     Sit to sidelying: Max assist;+2 for physical assistance General bed mobility comments: Cues for log roll technique. Assist to elevate trunk and to roll onto left side. Assist to bring LEs into bed and lower trunk.  Transfers Overall transfer level: Needs assistance Equipment used:  (back of recliner chair) Transfers: Sit to/from Stand Sit to Stand: Mod assist;From  elevated surface         General transfer comment: Cues to use body momentum to stand using chair to pull up on. + dizziness. Stood from [employer]EOB x2.   Ambulation/Gait Ambulation/Gait assistance: Min guard Ambulation Distance (Feet): 6 Feet Assistive device:  (using back of recliner chair for support) Gait Pattern/deviations: Step-through pattern;Decreased stride length;Wide base of support     General Gait Details: Able to take steps forward/backwards holding onto back of chair for support. Side stepped along side bed as well. + dizziness (improved from yesterday). HR 99-136 bpm.   Stairs            Wheelchair Mobility    Modified Rankin (Stroke Patients Only)       Balance Overall balance assessment: Needs assistance Sitting-balance support: Feet supported;Bilateral upper extremity supported Sitting balance-Leahy Scale: Fair Sitting balance - Comments: Able to sit with UE support. Difficulty scooting hips to get to EOB; Mod A and cues provided. Total A to donn LSO sitting EOB.   Standing balance support: During functional activity Standing balance-Leahy Scale: Poor Standing balance comment: Reliant on UEs for support on back of recliner chair.                    Cognition Arousal/Alertness: Awake/alert Behavior During Therapy: WFL for tasks assessed/performed Overall Cognitive Status: Within Functional Limits for tasks assessed                      Exercises      General Comments General comments (skin integrity, edema, etc.): Donned ted hose and SCDs prior to treatment due to soft BP with changes in position.  Pertinent Vitals/Pain Pain Assessment: 0-10 Pain Score: 5  Pain Location: back Pain Descriptors / Indicators: Sore;Operative site guarding;Guarding;Grimacing Pain Intervention(s): Monitored during session;Repositioned;Premedicated before session    Home Living                      Prior Function            PT Goals  (current goals can now be found in the care plan section) Progress towards PT goals: Progressing toward goals    Frequency    Min 5X/week      PT Plan Current plan remains appropriate    Co-evaluation PT/OT/SLP Co-Evaluation/Treatment: Yes Reason for Co-Treatment: Complexity of the patient's impairments (multi-system involvement);For patient/therapist safety;To address functional/ADL transfers PT goals addressed during session: Mobility/safety with mobility;Strengthening/ROM       End of Session Equipment Utilized During Treatment: Back brace Activity Tolerance: Patient tolerated treatment well Patient left: in bed;with call bell/phone within reach;with SCD's reapplied;Other (comment) (ace wraps donned)     Time: [id number] PT Time Calculation (min) (ACUTE ONLY): 46 min  Charges:  $Gait Training: 8-22 mins                    G Codes:      Monai Hindes A Jevonte Clanton 01/20/2016, 11:38 AM Mylo RedShauna Monterrio Gerst, PT, DPT [phone]

## 2016-01-20 NOTE — Progress Notes (Signed)
CSW consult acknowledged re "SNF Placement". PT/OT recommending home health follow up. RN Case Manager aware. CSW signing off. Please contact should new need(s) arise.          Enos FlingAshley Denelle Capurro, MSW, LCSW [facility]/4M Clinical Social Worker [phone]

## 2016-01-20 NOTE — Progress Notes (Signed)
Patient ID: Brent Cole, male   DOB: 04/08/1975, 40 y.o.   MRN: [id number] Vital signs are stable  Motor function in intact Increase in pain noted Foley out, voided once... Will add dose of decadron today to  Help with pain.

## 2016-01-20 NOTE — Progress Notes (Addendum)
Upon initial assessment, pt stated he was nauseated. Zofran PRN order was within time frame to be administered and pt received medication at 1945. Upon reassessment of nausea at 2015, pt stated nausea decreased but not completely gone. Around 2045 pt vomited 4 times - about 50mL all together.   Pt BP currently remaining in 180's systolic and had no PRN medications to administer.  Notified on-call neurosurgeon MD of pt's current situation and received verbal orders for: 12.5mg  phenergan q6 hours PRN, stat KUB, 10mg  labetalol q2 hours PRN for BP >160 systolic and hold PO medications until N/V is under control - turn continuous NS IV fluids back to ordered rate of 15350mL/hr to avoid dehydration until tolerating PO again.  Placed orders and will continue to monitor patient closely.  Francia GreavesSavannah R Theodore Rahrig, RN

## 2016-01-21 MED ORDER — MAGNESIUM HYDROXIDE 400 MG/5ML PO SUSP
30.0000 mL | ORAL | Status: DC | PRN
  Administered 2016-01-21: 30 mL via ORAL
  Filled 2016-01-21: qty 30

## 2016-01-21 NOTE — Progress Notes (Signed)
Occupational Therapy Treatment Patient Details Name: Brent Cole MRN: [id number] DOB: 03-28-1975 Today's Date: 01/21/2016    History of present illness Patient is a 40 y/o male with hx of HTN, ACL tear presents s/p L4-S1 laminectomy, discectomy with fusion.   OT comments  Pt making good progress. BP stable. HR increased to 124 when walking. Pt asymptomatic. Pt able to state 3/3 back precautions. Began discussing compensatory techniques and AE for ADL. Additional goals established. Will continue to follow to facilitate safe DC home.   Follow Up Recommendations  Home health OT    Equipment Recommendations  Other (comment) (TBA)    Recommendations for Other Services      Precautions / Restrictions Precautions Precautions: Back Precaution Booklet Issued: yes Precaution Comments: Able to recall 3/3 back precautions Required Braces or Orthoses: Spinal Brace Spinal Brace: Lumbar corset Restrictions Weight Bearing Restrictions: No       Mobility Bed Mobility  General bed mobility comments: Pt sitting EOB  Transfers Overall transfer level: Needs assistance Equipment used: Rolling walker (2 wheeled) Transfers: Sit to/from Stand Sit to Stand: From elevated surface;Min guard         General transfer comment:     Balance Overall balance assessment: Needs assistance Sitting-balance support: Feet supported;No upper extremity supported Sitting balance-Leahy Scale: Fair Sitting balance - Comments: Able to sit EOB without UE support today.    Standing balance support: During functional activity;Bilateral upper extremity supported Standing balance-Leahy Scale: Poor Standing balance comment: Reliant on UEs for support for standing.                   ADL Overall ADL's : Needs assistance/impaired                                     Functional mobility during ADLs: Minimal assistance;Rolling walker General ADL Comments: Discussed back precautions and  ADL. Educated pt on need for AE. Began discussion on pericare after back surgery and need for AE. Pt asked to prioritize what he needs to be able to do functionally.                                        Cognition   Behavior During Therapy: WFL for tasks assessed/performed Overall Cognitive Status: Within Functional Limits for tasks assessed                       Extremity/Trunk Assessment               Exercises     Shoulder Instructions       General Comments      Pertinent Vitals/ Pain       Pain Assessment: Faces Faces Pain Scale: Hurts little more Pain Location: discomfort at incision Pain Descriptors / Indicators: Sore;Operative site guarding Pain Intervention(s): Limited activity within patient's tolerance  Home Living                                          Prior Functioning/Environment              Frequency  Min 3X/week        Progress Toward Goals  OT Goals(current goals can now be  found in the care plan section)  Progress towards OT goals: Progressing toward goals  Acute Rehab OT Goals Patient Stated Goal: to get back to life OT Goal Formulation: With patient Time For Goal Achievement: 02/02/16 Potential to Achieve Goals: Good ADL Goals Pt Will Perform Grooming: with supervision;sitting Pt Will Perform Upper Body Bathing: with supervision;sitting Pt Will Perform Lower Body Bathing: with min assist;with adaptive equipment;with caregiver independent in assisting;sit to/from stand Pt Will Perform Lower Body Dressing: with min assist;with adaptive equipment;with caregiver independent in assisting;sit to/from stand Pt Will Transfer to Toilet: with supervision;bedside commode;ambulating Pt Will Perform Toileting - Clothing Manipulation and hygiene: with mod assist;sit to/from stand;sitting/lateral leans;with adaptive equipment;with caregiver independent in assisting Additional ADL Goal #1: Pt will  independently verbalize 3 back precautions for ADL  Plan Discharge plan remains appropriate    Co-evaluation    PT/OT/SLP Co-Evaluation/Treatment: Yes (partial session) Reason for Co-Treatment: Complexity of the patient's impairments (multi-system involvement);For patient/therapist safety;To address functional/ADL transfers PT goals addressed during session: Mobility/safety with mobility;Strengthening/ROM OT goals addressed during session: ADL's and self-care      End of Session Equipment Utilized During Treatment: Gait belt;Rolling walker;Back brace   Activity Tolerance Patient tolerated treatment well   Patient Left in chair;with call bell/phone within reach   Nurse Communication Mobility status;Precautions        Time: 1130-1153 OT Time Calculation (min): 23 min  Charges: OT General Charges $OT Visit: 1 Procedure OT Treatments $Self Care/Home Management : 8-22 mins  Shamon Lobo,HILLARY 01/21/2016, 1:46 PM   [facility] Campusil[facility] Jaydah Stahle, OT/L  [phone] 01/21/2016

## 2016-01-21 NOTE — Progress Notes (Signed)
Physical Therapy Treatment Patient Details Name: Brent Cole MRN: [id number] DOB: Oct 06, 1975 Today's Date: 01/21/2016    History of Present Illness Patient is a 40 y/o male with hx of HTN, ACL tear presents s/p L4-S1 laminectomy, discectomy with fusion.    PT Comments    Patient progressing well towards PT goals. BP more appropriate this session with ace wraps donned. Supine BP 142/84 Sitting BP 146/89 Standing BP 167/75 Sitting BP post walk 140/72 Tolerated gait training with use of RW 175' with chair follow for safety. Able to recall 3/3 back precautions. Still has some difficulty with transitions from sit to stand esp from low surfaces- due to weakness/height and anxiety related. Will need to negotiate stairs prior to d/c. Will follow acutely.   Follow Up Recommendations  Home health PT;Supervision for mobility/OOB     Equipment Recommendations  Rolling walker with 5" wheels (bariatric RW)    Recommendations for Other Services       Precautions / Restrictions Precautions Precautions: Back Precaution Booklet Issued: No Precaution Comments: Able to recall 3/3 back precautions Required Braces or Orthoses: Spinal Brace Spinal Brace: Lumbar corset Restrictions Weight Bearing Restrictions: No    Mobility  Bed Mobility Overal bed mobility: Needs Assistance Bed Mobility: Rolling;Sidelying to Sit;Sit to Sidelying Rolling: Min guard Sidelying to sit: Max assist;+2 for physical assistance;HOB elevated     Sit to sidelying: Max assist;+2 for physical assistance General bed mobility comments: Cues for log roll technique. Assist to elevate trunk, better with rolling today. Assist to bring LEs into bed and lower trunk.  Transfers Overall transfer level: Needs assistance Equipment used: Rolling walker (2 wheeled) (bari RW) Transfers: Sit to/from Stand Sit to Stand: Mod assist;From elevated surface         General transfer comment: Cues to use body momentum to stand  from EOBx1, from chair x1. No dizziness. SPT chair to bed later in morning.  Ambulation/Gait Ambulation/Gait assistance: Min guard Ambulation Distance (Feet): 175 Feet Assistive device: Rolling walker (2 wheeled) (bari RW) Gait Pattern/deviations: Step-through pattern;Decreased stride length;Wide base of support   Gait velocity interpretation: Below normal speed for age/gender General Gait Details: Slow, mostly steady gait with use of RW. No dizziness. BP stable. HR 98-124 bpm.   Stairs            Wheelchair Mobility    Modified Rankin (Stroke Patients Only)       Balance Overall balance assessment: Needs assistance Sitting-balance support: Feet supported;No upper extremity supported Sitting balance-Leahy Scale: Fair Sitting balance - Comments: Able to sit EOB without UE support today.    Standing balance support: During functional activity;Bilateral upper extremity supported Standing balance-Leahy Scale: Poor Standing balance comment: Reliant on UEs for support for standing.                    Cognition Arousal/Alertness: Awake/alert Behavior During Therapy: WFL for tasks assessed/performed Overall Cognitive Status: Within Functional Limits for tasks assessed                      Exercises      General Comments General comments (skin integrity, edema, etc.): Ted hose already donned prior to session for BP  control.      Pertinent Vitals/Pain Pain Assessment: Faces Faces Pain Scale: Hurts little more Pain Location: discomfort at incision Pain Descriptors / Indicators: Sore;Operative site guarding Pain Intervention(s): Monitored during session;Repositioned;Limited activity within patient's tolerance    Home Living  Prior Function            PT Goals (current goals can now be found in the care plan section) Progress towards PT goals: Progressing toward goals    Frequency    Min 5X/week      PT Plan  Current plan remains appropriate    Co-evaluation PT/OT/SLP Co-Evaluation/Treatment: Yes Reason for Co-Treatment: Complexity of the patient's impairments (multi-system involvement);To address functional/ADL transfers;For patient/therapist safety PT goals addressed during session: Mobility/safety with mobility;Strengthening/ROM       End of Session Equipment Utilized During Treatment: Back brace Activity Tolerance: Patient tolerated treatment well Patient left: in chair;with call bell/phone within reach;with nursing/sitter in room;with SCD's reapplied     Time: [id number] PT Time Calculation (min) (ACUTE ONLY): 42 min  Charges:  $Gait Training: 8-22 mins $Therapeutic Activity: 8-22 mins                    G Codes:      Harwood Nall A Angeletta Goelz 01/21/2016, 12:56 PM Mylo RedShauna Ayiden Milliman, PT, DPT [phone]

## 2016-01-21 NOTE — Progress Notes (Signed)
Patient ID: Brent Cole, male   DOB: [birth year], 40 y.o.   MRN: [id number] Vital signs are stable Motor function is intact Patient having nausea X-ray demonstrates ileus Mobilization encouraged Transfer to floor stable later this afternoon

## 2016-01-22 NOTE — Care Management Note (Signed)
Case Management Note  Patient Details  Name: Brent Cole MRN: [id number] Date of Birth: 1975-04-23  Subjective/Objective:    Discussed home health services with pt [name]  Pt reviewed list and chose [facility].                         Expected Discharge Plan:  Home w Home Health Services  Discharge planning Services  CM Consult     [facility]  [facility]  Status of Service:  In process, will continue to follow  Magdalene RiverMayo, Lillieanna Tuohy T, RN 01/22/2016, 1:15 PM

## 2016-01-22 NOTE — Progress Notes (Signed)
Occupational Therapy Treatment Patient Details Name: Darol Destinentuan Bufford MRN: [id number] DOB: [birth year] Today's Date: 01/22/2016    History of present illness Patient is a 40 y/o male with hx of HTN, ACL tear presents s/p L4-S1 laminectomy, discectomy with fusion.   OT comments  Pt requires (A) for all transfers and needs elevated surfaces. Pt can not exit the bed without (A) from therapist and pt agreeable at admission. Pt completed bed to chair transfer this session.    Follow Up Recommendations  CIR    Equipment Recommendations  Other (comment) (bariatric chair commode and bed)    Recommendations for Other Services Rehab consult    Precautions / Restrictions Precautions Precautions: Back Precaution Comments: able to recall precautions and awareness to BP concerns  Required Braces or Orthoses: Spinal Brace Spinal Brace: Lumbar corset;Applied in sitting position Restrictions Weight Bearing Restrictions: No       Mobility Bed Mobility Overal bed mobility: Needs Assistance Bed Mobility: Rolling;Sidelying to Sit;Sit to Sidelying Rolling: Min guard Sidelying to sit: Max assist;+2 for physical assistance;HOB elevated     Sit to sidelying: Max assist;+2 for physical assistance General bed mobility comments: Pt requires incr time and effort to reach EOB. Pt requires HOB elevated and bed max inflated and then deflated once BIL LE off EOB. pt with bed air removed and physical (A) from OT and PT able to elevate trunk from bed surface  Transfers Overall transfer level: Needs assistance Equipment used:  (using bariatric chair grab bar and bed handle) Transfers: Sit to/from Stand Sit to Stand: +2 physical assistance;Mod assist;+2 safety/equipment;From elevated surface         General transfer comment: cues for hand placement and several attempts before power up into standing    Balance Overall balance assessment: Needs assistance Sitting-balance support: Feet supported;Single  extremity supported Sitting balance-Leahy Scale: Fair Sitting balance - Comments: pt requires incr time and effort and able to release bed surface briefly to attempt (A) with don of brace   Standing balance support: Bilateral upper extremity supported;During functional activity Standing balance-Leahy Scale: Poor Standing balance comment: relies heavily on RW and reports BIL UE fatigue due to RW use                   ADL Overall ADL's : Needs assistance/impaired Eating/Feeding: Set up;Bed level   Grooming: Wash/dry hands;Set up   Upper Body Bathing: Maximal assistance   Lower Body Bathing: Total assistance   Upper Body Dressing : Moderate assistance Upper Body Dressing Details (indicate cue type and reason): pt able to place brace over head and connect aspenc brace. pt unable to tighten brace or properly align without (A) Lower Body Dressing: Total assistance   Toilet Transfer: +2 for physical assistance;Minimal assistance;RW;Requires wide/bariatric (elevated surface) Toilet Transfer Details (indicate cue type and reason): pt currently requires bariatric chair placed in front of patient for grab bar and total +2 (A) to power up into standing Toileting- Clothing Manipulation and Hygiene: Total assistance       Functional mobility during ADLs: Moderate assistance;Rolling walker (chair follow) General ADL Comments: pt requires multiple rest breaks during session due to fatigue. pt demonstrates ability to transfer to recliner for the first time. pt requesting to shower but upon OT return reports need to rest and fatigue from pain medication. OT educated RN and patient on need to use ace wraps for BP and static standing for bathing.       Vision  Perception     Praxis      Cognition   Behavior During Therapy: WFL for tasks assessed/performed Overall Cognitive Status: Within Functional Limits for tasks assessed                        Extremity/Trunk Assessment               Exercises     Shoulder Instructions       General Comments      Pertinent Vitals/ Pain       Pain Assessment: Faces Faces Pain Scale: Hurts even more Pain Location: discomfort at R LE Pain Descriptors / Indicators: Sore;Operative site guarding Pain Intervention(s): Limited activity within patient's tolerance;Premedicated before session;Repositioned;Monitored during session  Home Living                                          Prior Functioning/Environment              Frequency  Min 3X/week        Progress Toward Goals  OT Goals(current goals can now be found in the care plan section)  Progress towards OT goals: Progressing toward goals  Acute Rehab OT Goals Patient Stated Goal: to get back to life OT Goal Formulation: With patient Time For Goal Achievement: 02/02/16 Potential to Achieve Goals: Good ADL Goals Pt Will Perform Grooming: with supervision;sitting Pt Will Perform Upper Body Bathing: with supervision;sitting Pt Will Perform Lower Body Bathing: with min assist;with adaptive equipment;with caregiver independent in assisting;sit to/from stand Pt Will Perform Lower Body Dressing: with min assist;with adaptive equipment;with caregiver independent in assisting;sit to/from stand Pt Will Transfer to Toilet: with supervision;bedside commode;ambulating Pt Will Perform Toileting - Clothing Manipulation and hygiene: with mod assist;sit to/from stand;sitting/lateral leans;with adaptive equipment;with caregiver independent in assisting Additional ADL Goal #1: Pt will independently verbalize 3 back precautions for ADL  Plan Discharge plan remains appropriate    Co-evaluation    PT/OT/SLP Co-Evaluation/Treatment: Yes Reason for Co-Treatment: Complexity of the patient's impairments (multi-system involvement);For patient/therapist safety;To address functional/ADL transfers   OT goals addressed  during session: ADL's and self-care;Proper use of Adaptive equipment and DME;Strengthening/ROM      End of Session Equipment Utilized During Treatment: Rolling walker;Gait belt;Back brace   Activity Tolerance Patient tolerated treatment well   Patient Left in chair;with call bell/phone within reach;with restraints reapplied (safety belt in place)   Nurse Communication Mobility status;Precautions        Time: 1020-1109 OT Time Calculation (min): 49 min  Charges: OT General Charges $OT Visit: 1 Procedure OT Treatments $Self Care/Home Management : 8-22 mins  Boone MasterJones, Prather Failla B 01/22/2016, 11:41 AM    Mateo FlowJones, Brynn   OTR/L Pager: [phone] Office: [phone] .

## 2016-01-22 NOTE — Progress Notes (Signed)
Physical Therapy Treatment Patient Details Name: Brent Cole MRN: [id number] DOB: Apr 15, 1975 Today's Date: 01/22/2016    History of Present Illness Patient is a 40 y/o male with hx of HTN, ACL tear presents s/p L4-S1 laminectomy, discectomy with fusion.    PT Comments    Pt progressing towards physical therapy goals. Was able to ambulate ~200 feet today with 1 standing rest break. Pt reports UE fatigue from RW dependence. Bari recliner, RW and [facility] all now present in room. Pt used support poles on the bari chair to achieve full standing. Increased time and assist to keep chair from moving during transfer required. Pt agreeable to CIR admission and appears very motivated to participate with therapy. Will continue to follow.   Follow Up Recommendations  CIR;Supervision/Assistance - 24 hour     Equipment Recommendations  Rolling walker with 5" wheels Brent Cole(Bari RW)    Recommendations for Other Services Rehab consult     Precautions / Restrictions Precautions Precautions: Back Precaution Comments: able to recall precautions and awareness to BP concerns  Required Braces or Orthoses: Spinal Brace Spinal Brace: Lumbar corset;Applied in sitting position Restrictions Weight Bearing Restrictions: No    Mobility  Bed Mobility Overal bed mobility: Needs Assistance Bed Mobility: Rolling;Sidelying to Sit Rolling: Min guard Sidelying to sit: Max assist;+2 for physical assistance;HOB elevated     Sit to sidelying: Max assist;+2 for physical assistance General bed mobility comments: Pt requires incr time and effort to reach EOB. Pt requires HOB elevated and bed max inflated and then deflated once BIL LE off EOB. pt with bed air removed and physical (A) from OT and PT able to elevate trunk from bed surface  Transfers Overall transfer level: Needs assistance Equipment used:  (using bariatric chair grab bar and bed handle) Transfers: Sit to/from Stand Sit to Stand: +2 physical assistance;Mod  assist;+2 safety/equipment;From elevated surface         General transfer comment: cues for hand placement and several attempts before power up into standing  Ambulation/Gait Ambulation/Gait assistance: Min guard Ambulation Distance (Feet): 200 Feet Assistive device: Rolling walker (2 wheeled) (Bari R) Gait Pattern/deviations: Step-through pattern;Decreased stride length;Wide base of support Gait velocity: Decreased Gait velocity interpretation: Below normal speed for age/gender General Gait Details: Slow, mostly steady gait with use of RW. No dizziness. BP stable.    Stairs            Wheelchair Mobility    Modified Rankin (Stroke Patients Only)       Balance Overall balance assessment: Needs assistance Sitting-balance support: Feet supported;Single extremity supported Sitting balance-Leahy Scale: Fair Sitting balance - Comments: pt requires incr time and effort and able to release bed surface briefly to attempt (A) with don of brace   Standing balance support: Bilateral upper extremity supported;During functional activity Standing balance-Leahy Scale: Poor Standing balance comment: relies heavily on RW and reports BIL UE fatigue due to RW use                    Cognition Arousal/Alertness: Awake/alert Behavior During Therapy: WFL for tasks assessed/performed Overall Cognitive Status: Within Functional Limits for tasks assessed                      Exercises      General Comments General comments (skin integrity, edema, etc.): dry and intact      Pertinent Vitals/Pain Pain Assessment: Faces Faces Pain Scale: Hurts even more Pain Location: discomfort at R LE Pain Descriptors /  Indicators: Sore;Operative site guarding Pain Intervention(s): Limited activity within patient's tolerance;Monitored during session;Repositioned    Home Living                      Prior Function            PT Goals (current goals can now be found in  the care plan section) Acute Rehab PT Goals Patient Stated Goal: to get back to life PT Goal Formulation: With patient Time For Goal Achievement: 02/02/16 Potential to Achieve Goals: Good Progress towards PT goals: Progressing toward goals    Frequency    Min 5X/week      PT Plan Discharge plan needs to be updated    Co-evaluation PT/OT/SLP Co-Evaluation/Treatment: Yes Reason for Co-Treatment: Complexity of the patient's impairments (multi-system involvement);For patient/therapist safety PT goals addressed during session: Mobility/safety with mobility;Balance;Proper use of DME OT goals addressed during session: ADL's and self-care;Proper use of Adaptive equipment and DME;Strengthening/ROM     End of Session Equipment Utilized During Treatment: Back brace Activity Tolerance: Patient tolerated treatment well Patient left: in chair;with call bell/phone within reach     Time: 1020-1109 PT Time Calculation (min) (ACUTE ONLY): 49 min  Charges:  $Gait Training: 8-22 mins $Therapeutic Activity: 8-22 mins                    G Codes:      Marylynn PearsonLaura D Chidubem Chaires 01/22/2016, 12:08 PM   Conni SlipperLaura Letricia Krinsky, PT, DPT Acute Rehabilitation Services Pager: [phone]

## 2016-01-22 NOTE — Progress Notes (Signed)
Rehab Admissions Coordinator Note:  Patient was screened by Trish MageLogue, Cathleen Yagi M for appropriateness for an Inpatient Acute Rehab Consult.  At this time, we are recommending Inpatient Rehab consult.  Trish MageLogue, Tillmon Kisling M 01/22/2016, 1:52 PM  I can be reached at [phone].

## 2016-01-23 NOTE — Progress Notes (Signed)
Physical Therapy Treatment Patient Details Name: Brent Cole MRN: [id number] DOB: Feb 09, 1975 Today's Date: February 14, 2016    History of Present Illness Patient is a 40 y/o male with hx of HTN, ACL tear presents s/p L4-S1 laminectomy, discectomy with fusion.    PT Comments    Patient continues to progress with ambulation.  From OT note, it appears bed mobility is limited factor - will continue to benefit from PT to address bed mobility/transfers and continue to progress gait.   Follow Up Recommendations  CIR     Equipment Recommendations  Rolling walker with 5" wheels    Recommendations for Other Services Rehab consult     Precautions / Restrictions Precautions Precautions: Back Precaution Comments: able to recall precautions and awareness to BP concerns  Required Braces or Orthoses: Spinal Brace Spinal Brace: Lumbar corset (patient had already donned)    Mobility  Bed Mobility      Transfers Overall transfer level: Needs assistance Equipment used: Rolling walker (2 wheeled) Transfers: Sit to/from Stand Sit to Stand: Supervision         General transfer comment: patient used chair rail for support in sit to stand  Ambulation/Gait Ambulation/Gait assistance: Supervision Ambulation Distance (Feet): 200 Feet Assistive device: Rolling walker (2 wheeled) Gait Pattern/deviations: Wide base of support;Step-through pattern Gait velocity: Decreased   General Gait Details:  steady gait with use of RW. No dizziness.   Stairs            Wheelchair Mobility    Modified Rankin (Stroke Patients Only)       Balance Overall balance assessment: Needs assistance         Standing balance support: Bilateral upper extremity supported Standing balance-Leahy Scale: Poor Standing balance comment: reliant on RW and report UE fatigue near end of session due to RW use.                    Cognition Arousal/Alertness: Awake/alert Behavior During Therapy: WFL  for tasks assessed/performed Overall Cognitive Status: Within Functional Limits for tasks assessed                      Exercises      General Comments        Pertinent Vitals/Pain Pain Assessment: No/denies pain    Home Living                      Prior Function            PT Goals (current goals can now be found in the care plan section) Progress towards PT goals: Progressing toward goals;Goals met and updated - see care plan    Frequency    Min 5X/week      PT Plan Current plan remains appropriate    Co-evaluation             End of Session Equipment Utilized During Treatment: Back brace Activity Tolerance: Patient tolerated treatment well Patient left: in chair;with call bell/phone within reach     Time: 1019-1032 PT Time Calculation (min) (ACUTE ONLY): 13 min  Charges:  $Gait Training: 8-22 mins                    G Codes:      Shanna Cisco 02/14/2016, 10:53 AM  February 14, 2016 [place], [place]

## 2016-01-23 NOTE — [place] FL2 (Signed)
[place] MEDICAID FL2 LEVEL OF CARE SCREENING TOOL     IDENTIFICATION  Patient Name: Brent Cole Birthdate: [birth year] Sex: male Admission Date (Current Location): 01/18/2016  [facility] [place]   Facility and Address:  [facility], [address], [place], [place] [address]      Provider Number: [id number]  Attending Physician Name and Address:  Barnett AbuHenry Elsner, MD  Relative Name and Phone Number:       Current Level of Care: Hospital Recommended Level of Care: Skilled Nursing Facility Prior Approval Number:    Date Approved/Denied:   PASRR Number:   [id number]  Discharge Plan: SNF    Current Diagnoses: Patient Active Problem List   Diagnosis Date Noted  . Spondylolisthesis at L4-L5 level 01/18/2016    Orientation RESPIRATION BLADDER Height & Weight     Self, Time, Situation, Place  Normal Continent Weight: (!) 465 lb (210.9 kg) Height:  6\' 1"  (185.4 cm)  BEHAVIORAL SYMPTOMS/MOOD NEUROLOGICAL BOWEL NUTRITION STATUS      Continent Diet (Healthy Heart, thin fluid consistency )  AMBULATORY STATUS COMMUNICATION OF NEEDS Skin   Limited Assist Verbally Normal                       Personal Care Assistance Level of Assistance  Bathing, Feeding, Dressing Bathing Assistance: Limited assistance Feeding assistance: Independent Dressing Assistance: Limited assistance     Functional Limitations Info  Sight, Hearing, Speech Sight Info: Adequate Hearing Info: Adequate Speech Info: Adequate    SPECIAL CARE FACTORS FREQUENCY  PT (By licensed PT), OT (By licensed OT)     PT Frequency: 5x OT Frequency: 5x            Contractures Contractures Info: Not present    Additional Factors Info  Code Status, Allergies Code Status Info: Full Allergies Info: No known allergies            Current Medications (01/23/2016):  This is the current hospital active medication list Current Facility-Administered  Medications  Medication Dose Route Frequency Provider Last Rate Last Dose  . 0.9 %  sodium chloride infusion  250 mL Intravenous Continuous Barnett AbuHenry Elsner, MD 10 mL/hr at 01/20/16 1800 250 mL at 01/20/16 1800  . 0.9 %  sodium chloride infusion   Intravenous Continuous Barnett AbuHenry Elsner, MD   Stopped at 01/21/16 1200  . acetaminophen (TYLENOL) tablet 650 mg  650 mg Oral Q4H PRN Barnett AbuHenry Elsner, MD       Or  . acetaminophen (TYLENOL) suppository 650 mg  650 mg Rectal Q4H PRN Barnett AbuHenry Elsner, MD      . alum & mag hydroxide-simeth (MAALOX/MYLANTA) 200-200-20 MG/5ML suspension 30 mL  30 mL Oral Q6H PRN Barnett AbuHenry Elsner, MD   30 mL at 01/20/16 1725  . amLODipine (NORVASC) tablet 5 mg  5 mg Oral Daily Barnett AbuHenry Elsner, MD   5 mg at 01/23/16 1049  . bisacodyl (DULCOLAX) suppository 10 mg  10 mg Rectal Daily PRN Barnett AbuHenry Elsner, MD      . docusate sodium (COLACE) capsule 100 mg  100 mg Oral BID Barnett AbuHenry Elsner, MD   100 mg at 01/23/16 1049  . losartan (COZAAR) tablet 100 mg  100 mg Oral Daily Barnett AbuHenry Elsner, MD   100 mg at 01/23/16 1048   And  . hydrochlorothiazide (HYDRODIURIL) tablet 25 mg  25 mg Oral Daily Barnett AbuHenry Elsner, MD   25 mg at 01/23/16 1049  . HYDROcodone-acetaminophen (NORCO) 10-325 MG per tablet  1 tablet  1 tablet Oral Q8H PRN Barnett AbuHenry Elsner, MD   1 tablet at 01/19/16 2248  . HYDROmorphone (DILAUDID) injection 0.5-1 mg  0.5-1 mg Intravenous Q2H PRN Barnett AbuHenry Elsner, MD   1 mg at 01/19/16 1953  . labetalol (NORMODYNE,TRANDATE) injection 10 mg  10 mg Intravenous Q2H PRN Donalee CitrinGary Cram, MD   10 mg at 01/20/16 2339  . magnesium hydroxide (MILK OF MAGNESIA) suspension 30 mL  30 mL Oral Q4H PRN Barnett AbuHenry Elsner, MD   30 mL at 01/21/16 1034  . menthol-cetylpyridinium (CEPACOL) lozenge 3 mg  1 lozenge Oral PRN Barnett AbuHenry Elsner, MD       Or  . phenol (CHLORASEPTIC) mouth spray 1 spray  1 spray Mouth/Throat PRN Barnett AbuHenry Elsner, MD      . methocarbamol (ROBAXIN) tablet 500 mg  500 mg Oral Q6H PRN Barnett AbuHenry Elsner, MD   500 mg at 01/20/16 0604   Or  .  methocarbamol (ROBAXIN) 500 mg in dextrose 5 % 50 mL IVPB  500 mg Intravenous Q6H PRN Barnett AbuHenry Elsner, MD      . ondansetron [facility](ZOFRAN) injection 4 mg  4 mg Intravenous Q4H PRN Barnett AbuHenry Elsner, MD   4 mg at 01/20/16 1945  . oxyCODONE-acetaminophen (PERCOCET/ROXICET) 5-325 MG per tablet 1-2 tablet  1-2 tablet Oral Q4H PRN Barnett AbuHenry Elsner, MD   2 tablet at 01/23/16 0040  . polyethylene glycol (MIRALAX / GLYCOLAX) packet 17 g  17 g Oral Daily PRN Barnett AbuHenry Elsner, MD      . promethazine (PHENERGAN) injection 12.5 mg  12.5 mg Intravenous Q6H PRN Donalee CitrinGary Cram, MD   12.5 mg at 01/20/16 2132  . senna (SENOKOT) tablet 8.6 mg  1 tablet Oral BID Barnett AbuHenry Elsner, MD   8.6 mg at 01/21/16 1030  . sodium chloride flush (NS) 0.9 % injection 3 mL  3 mL Intravenous Q12H Barnett AbuHenry Elsner, MD   3 mL at 01/23/16 1051  . sodium chloride flush (NS) 0.9 % injection 3 mL  3 mL Intravenous PRN Barnett AbuHenry Elsner, MD         Discharge Medications: Please see discharge summary for a list of discharge medications.  Relevant Imaging Results:  Relevant Lab Results:   Additional Information SSN: [id number]  Jonathon JordanLynn B Bryant, LCSWA

## 2016-01-23 NOTE — Progress Notes (Signed)
Patient ID: Brent Cole, male   DOB: 08-Jan-1976, 40 y.o.   MRN: [id number] Vital signs are stable patient has moderate back pain moving slowly no bowel movement yet His dressing remains clean and intact physical therapy is suggested a rehabilitation med consult for possible inpatient admission this is been placed however evaluation will not likely be until Monday patient does have limited resources but if he is a candidate for inpatient rehabilitation this would be to his benefit we'll continue to monitor him over the weekend he has had some laxatives to induce bowel movement. We will continue to observe and continue therapies over the weekend

## 2016-01-23 NOTE — Progress Notes (Signed)
Occupational Therapy Treatment Patient Details Name: Darol Destinentuan Elmendorf MRN: [id number] DOB: 11-10-1975 Today's Date: 01/23/2016    History of present illness Patient is a 40 y/o male with hx of HTN, ACL tear presents s/p L4-S1 laminectomy, discectomy with fusion.   OT comments  Pt. Is very motivated and eager to participate in skilled therapies.  Making gains with bed mobility and sit/stand.  Will benefit from continued therapy prior to home to increase safety and independence with functional mobility and ADL completion.    Follow Up Recommendations  CIR    Equipment Recommendations       Recommendations for Other Services Rehab consult    Precautions / Restrictions Precautions Precautions: Back Required Braces or Orthoses: Spinal Brace Spinal Brace: Lumbar corset;Applied in sitting position       Mobility Bed Mobility Overal bed mobility: Needs Assistance Bed Mobility: Rolling;Sidelying to Sit Rolling: Min guard Sidelying to sit: Max assist;HOB elevated       General bed mobility comments: increased time to transition into sitting, pt. relying heavily on bed rails and elevated HOB.  requesting "let me try, i can do it", demonstrated good tech. with log roll.  max a to bring trunk upright.  able to scoot hips forward for increasing weight bearing through feet.   Transfers Overall transfer level: Needs assistance Equipment used: Rolling walker (2 wheeled) Transfers: Sit to/from [employer] CorporationStand;Stand Pivot Transfers Sit to Stand: Supervision         General transfer comment: cues for hand placement.  pt. able to use bed rail for RUE support on bed rail and L hand on RW to transition into standing.  requesting "back up a little im going to do it by myself, trust me".  slow to transition into standing but once up was able to stand and adjust brace before beginning pivotal steps to recliner    Balance                                   ADL Overall ADL's : Needs  assistance/impaired Eating/Feeding: Set up;Sitting               Upper Body Dressing : Minimal assistance;Sitting Upper Body Dressing Details (indicate cue type and reason): min a to don brace seated eob.     Toilet Transfer: Minimal assistance;+2 for safety/equipment;Requires wide/bariatric Toilet Transfer Details (indicate cue type and reason): pt. able to simulate bsc transfer from eob with approx 5 pivotal steps.           Functional mobility during ADLs: Minimal assistance;Rolling walker        Vision                     Perception     Praxis      Cognition   Behavior During Therapy: WFL for tasks assessed/performed Overall Cognitive Status: Within Functional Limits for tasks assessed                       Extremity/Trunk Assessment               Exercises     Shoulder Instructions       General Comments      Pertinent Vitals/ Pain       Pain Assessment: No/denies pain  Home Living  Prior Functioning/Environment              Frequency  Min 3X/week        Progress Toward Goals  OT Goals(current goals can now be found in the care plan section)  Progress towards OT goals: Progressing toward goals     Plan Discharge plan remains appropriate    Co-evaluation                 End of Session Equipment Utilized During Treatment: Gait belt;Rolling walker;Back brace   Activity Tolerance Patient tolerated treatment well   Patient Left in chair;with call bell/phone within reach   Nurse Communication  (pt. present for session and assisted with mobility.  discussed placement of bsc beside recliner if needed)        Time: 7829-56210844-0911 OT Time Calculation (min): 27 min  Charges: OT General Charges $OT Visit: 1 Procedure OT Treatments $Self Care/Home Management : 23-37 mins  Robet LeuMorris, Akesha Uresti Lorraine, COTA/L 01/23/2016, 9:36 AM

## 2016-01-24 NOTE — Progress Notes (Signed)
Physical Therapy Treatment Patient Details Name: Darol Destinentuan Esquer MRN: [id number] DOB: 03-30-1975 Today's Date: 01/24/2016    History of Present Illness Patient is a 40 y/o male with hx of HTN, ACL tear presents s/p L4-S1 laminectomy, discectomy with fusion.    PT Comments    Patient continues to progress gradually with mobility. Tolerated gait/stair training this session. Pt fatigues quickly. Continues to have most difficulty with bed motility and transitional movement however performed with less assist this session. Continue to progress as tolerated.   Follow Up Recommendations  CIR     Equipment Recommendations  Rolling walker with 5" wheels (bariatric)    Recommendations for Other Services Rehab consult     Precautions / Restrictions Precautions Precautions: Back Precaution Comments: able to recall precautions  Required Braces or Orthoses: Spinal Brace Spinal Brace: Lumbar corset    Mobility  Bed Mobility Overal bed mobility: Needs Assistance Bed Mobility: Rolling;Sidelying to Sit Rolling: Min guard Sidelying to sit: Min guard       General bed mobility comments: min guard for safety; cues for sequencing and technique; heavy reliance on bed rails; HOB flat  Transfers Overall transfer level: Needs assistance Equipment used: Rolling walker (2 wheeled) Transfers: Sit to/from Stand Sit to Stand: Min guard         General transfer comment: min guard for safety; increased time and effort needed for all transitional movement however no physical assist required  Ambulation/Gait Ambulation/Gait assistance: Supervision Ambulation Distance (Feet): 200 Feet Assistive device: Rolling walker (2 wheeled) Gait Pattern/deviations: Wide base of support;Step-through pattern     General Gait Details: cues for posture; pt with less reliance on UE support with cues and increased distance   Stairs Stairs: Yes   Stair Management: One rail Left;Sideways Number of Stairs:  3 General stair comments: cues for technique and safety  Wheelchair Mobility    Modified Rankin (Stroke Patients Only)       Balance                                    Cognition Arousal/Alertness: Awake/alert Behavior During Therapy: WFL for tasks assessed/performed Overall Cognitive Status: Within Functional Limits for tasks assessed                      Exercises      General Comments        Pertinent Vitals/Pain Pain Assessment: Faces Faces Pain Scale: Hurts even more Pain Location: back and R LE with transitional movements Pain Descriptors / Indicators: Guarding;Sore Pain Intervention(s): Limited activity within patient's tolerance;Monitored during session;Premedicated before session;Repositioned    Home Living                      Prior Function            PT Goals (current goals can now be found in the care plan section) Acute Rehab PT Goals Patient Stated Goal: to get back to life Progress towards PT goals: Progressing toward goals    Frequency    Min 5X/week      PT Plan Current plan remains appropriate    Co-evaluation             End of Session Equipment Utilized During Treatment: Back brace Activity Tolerance: Patient tolerated treatment well Patient left: in chair;with call bell/phone within reach     Time: 3295-18840958-1034 PT Time Calculation (min) (ACUTE  ONLY): 36 min  Charges:  $Gait Training: 8-22 mins $Therapeutic Activity: 8-22 mins                    G Codes:      Derek MoundKellyn R Kiora Hallberg Laisa Larrick, PTA Pager: [phone]   01/24/2016, 10:41 AM

## 2016-01-24 NOTE — Progress Notes (Signed)
Overall progressing well. Pain well controlled. No radicular symptoms. Patient making progress with therapy but mobility remains to poor for home discharge.  He is afebrile. His vitals are stable. He is awake and alert. He is oriented and appropriate. Motor and sensory function are stable. Dressing dry. Abdomen soft.  Progressing well following 2 level lumbar decompression and fusion. Situation, dictated by extreme morbid obesity limiting his ability to mobilize. Possible transfer to rehabilitation unit this week.

## 2016-01-25 ENCOUNTER — Encounter (HOSPITAL_COMMUNITY): Payer: Self-pay | Admitting: Family Medicine

## 2016-01-25 ENCOUNTER — Inpatient Hospital Stay (HOSPITAL_COMMUNITY): Payer: Commercial Managed Care - HMO

## 2016-01-25 DIAGNOSIS — M4316 Spondylolisthesis, lumbar region: Secondary | ICD-10-CM

## 2016-01-25 DIAGNOSIS — I959 Hypotension, unspecified: Secondary | ICD-10-CM

## 2016-01-25 DIAGNOSIS — N179 Acute kidney failure, unspecified: Secondary | ICD-10-CM

## 2016-01-25 DIAGNOSIS — D62 Acute posthemorrhagic anemia: Secondary | ICD-10-CM

## 2016-01-25 DIAGNOSIS — Z419 Encounter for procedure for purposes other than remedying health state, unspecified: Secondary | ICD-10-CM

## 2016-01-25 DIAGNOSIS — R739 Hyperglycemia, unspecified: Secondary | ICD-10-CM

## 2016-01-25 DIAGNOSIS — G4733 Obstructive sleep apnea (adult) (pediatric): Secondary | ICD-10-CM

## 2016-01-25 LAB — GLUCOSE, CAPILLARY: GLUCOSE-CAPILLARY: 141 mg/dL — AB (ref 65–99)

## 2016-01-25 LAB — CORTISOL-AM, BLOOD: CORTISOL - AM: 8 ug/dL (ref 6.7–22.6)

## 2016-01-25 MED ORDER — SODIUM CHLORIDE 0.9 % IV SOLN
INTRAVENOUS | Status: DC
  Administered 2016-01-25 – 2016-01-26 (×2): via INTRAVENOUS

## 2016-01-25 MED ORDER — HYDRALAZINE HCL 20 MG/ML IJ SOLN
5.0000 mg | INTRAMUSCULAR | Status: DC | PRN
Start: 2016-01-25 — End: 2016-01-28

## 2016-01-25 MED ORDER — SODIUM CHLORIDE 0.9 % IV BOLUS (SEPSIS)
1000.0000 mL | Freq: Once | INTRAVENOUS | Status: AC
  Administered 2016-01-25: 1000 mL via INTRAVENOUS

## 2016-01-25 NOTE — Progress Notes (Signed)
Thank you for consult on [name]. He is progressing well with therapy--no physical assistance required with tranfers and is at supervision level  for ambulating 200 feet. He's too high level for CIR. Will defer consult.

## 2016-01-25 NOTE — Progress Notes (Signed)
RT set up patient CPAP HS. No O2 bleed in needed. Patient will call when he is ready to go on Cpap

## 2016-01-25 NOTE — Progress Notes (Addendum)
Late entry for documentation of session 01/25/16 1058-1128 am.  Brent Cole, OTR/L [phone] 01/25/16 15:44   Occupational Therapy Treatment Patient Details Name: Brent Cole MRN: [id number] DOB: [birth year] Today's Date: 01/25/2016    History of present illness Patient is a 40 y/o male with hx of HTN, ACL tear presents s/p L4-S1 laminectomy, discectomy with fusion.   OT comments  Pt progressing well toward OT goals. Able to recall all back precautions this session. Pt continues to require increased time for all mobility and ADL but highly motivated to complete without assistance. No complaints of dizziness during session and BP stable. Able to don brace with min assist to align. Able to adjust brace while standing without UE support and with min guard assist from therapist. Pt would continue to benefit from OT services while admitted to improve independence with ADL and continue to recommend CIR placement for continued rehabilitation.   Follow Up Recommendations  CIR    Equipment Recommendations  Other (comment) (TBD and will need bariatric)    Recommendations for Other Services Rehab consult    Precautions / Restrictions Precautions Precautions: Back Precaution Booklet Issued: No Precaution Comments: able to recall precautions  Required Braces or Orthoses: Spinal Brace Spinal Brace: Lumbar corset Restrictions Weight Bearing Restrictions: No       Mobility Bed Mobility Overal bed mobility: Needs Assistance Bed Mobility: Rolling;Sidelying to Sit Rolling: Min guard Sidelying to sit: Min guard       General bed mobility comments: Min guard for safety and cues for safety to complete with increased time.  Transfers Overall transfer level: Needs assistance Equipment used: Rolling walker (2 wheeled) Transfers: Sit to/from Stand Sit to Stand: Min guard         General transfer comment: min guard for safety; increased time and effort needed for all transitional  movement however no physical assist required    Balance Overall balance assessment: Needs assistance Sitting-balance support: Feet supported;Single extremity supported Sitting balance-Leahy Scale: Fair Sitting balance - Comments: Sit at EOB for donning/doffing brace   Standing balance support: Bilateral upper extremity supported Standing balance-Leahy Scale: Fair Standing balance comment: Able to stand briefly without UE support to tighten brace.                   ADL Overall ADL's : Needs assistance/impaired                 Upper Body Dressing : Sitting;Minimal assistance Upper Body Dressing Details (indicate cue type and reason): Donned gown as a robe with set-up. Min assist for donning brace seated at EOB to align over spine. Lower Body Dressing: Total assistance   Toilet Transfer: Min guard;Ambulation;RW Toilet Transfer Details (indicate cue type and reason): simulated BSC transfer from EOB and ambulating to lift chair         Functional mobility during ADLs: Min guard;Rolling walker General ADL Comments: Pt requires increased time throughout but is highly motivated to complete tasks without assist.      Vision                     Perception     Praxis      Cognition   Behavior During Therapy: [facility]FL for tasks assessed/performed Overall Cognitive Status: Within Functional Limits for tasks assessed                       Extremity/Trunk Assessment  Exercises     Shoulder Instructions       General Comments      Pertinent Vitals/ Pain       Pain Assessment: Faces Faces Pain Scale: Hurts little more Pain Location: back and R LE with transitional movements Pain Descriptors / Indicators: Guarding;Sore Pain Intervention(s): Limited activity within patient's tolerance;Monitored during session;Repositioned  Home Living                                          Prior Functioning/Environment               Frequency  Min 3X/week        Progress Toward Goals  OT Goals(current goals can now be found in the care plan section)  Progress towards OT goals: Progressing toward goals  Acute Rehab OT Goals Patient Stated Goal: to get back to life OT Goal Formulation: With patient Time For Goal Achievement: 02/02/16 Potential to Achieve Goals: Good ADL Goals Pt Will Perform Grooming: with supervision;sitting Pt Will Perform Upper Body Bathing: with supervision;sitting Pt Will Perform Lower Body Bathing: with min assist;with adaptive equipment;with caregiver independent in assisting;sit to/from stand Pt Will Perform Lower Body Dressing: with min assist;with adaptive equipment;with caregiver independent in assisting;sit to/from stand Pt Will Transfer to Toilet: with supervision;bedside commode;ambulating Pt Will Perform Toileting - Clothing Manipulation and hygiene: with mod assist;sit to/from stand;sitting/lateral leans;with adaptive equipment;with caregiver independent in assisting Additional ADL Goal #1: Pt will independently verbalize 3 back precautions for ADL  Plan Discharge plan remains appropriate    Co-evaluation                 End of Session Equipment Utilized During Treatment: Gait belt;Rolling walker;Back brace   Activity Tolerance Patient tolerated treatment well   Patient Left in chair;with call bell/phone within reach   Nurse Communication          Time: [id number] OT Time Calculation (min): 30 min  Charges: OT General Charges $OT Visit: 1 Procedure OT Treatments $Self Care/Home Management : 23-37 mins  Brent Cole, OTR/L [phone] 01/25/2016, 3:40 PM

## 2016-01-25 NOTE — Progress Notes (Signed)
PT Cancellation Note  Patient Details Name: Brent Cole MRN: [id number] DOB: Oct 20, 1975   Cancelled Treatment:    Reason Eval/Treat Not Completed: Medical issues which prohibited therapy. Holding PT at this time per RN, as they address recent consistently low BP's. Will check back as schedule allows to continue with PT POC.    Marylynn PearsonLaura D Allyn Bertoni 01/25/2016, 1:49 PM   Conni SlipperLaura Arali Somera, PT, DPT Acute Rehabilitation Services Pager: [phone]

## 2016-01-25 NOTE — Care Management Note (Signed)
Case Management Note  Patient Details  Name: Brent Cole MRN: [id number] Date of Birth: 1975-09-14  Subjective/Objective:                    Action/Plan: CM spoke with patient about home health. Pt a little concerned about going home alone. Pt does have a friend in [place] that he can stay with after d/c. CM will revisit patient and finalize plans closer to d/c.   Expected Discharge Date:                  Expected Discharge Plan:  Home w Home Health Services  In-House Referral:     Discharge planning Services  CM Consult  Post Acute Care Choice:    Choice offered to:     DME Arranged:    DME Agency:     HH Arranged:  RN, PT, OT HH Agency:  [facility]  Status of Service:  In process, will continue to follow  If discussed at Long Length of Stay Meetings, dates discussed:    Additional Comments:  Brent BaloKelli F Azrael Maddix, RN 01/25/2016, 4:53 PM

## 2016-01-25 NOTE — Progress Notes (Signed)
Patient ID: Brent Cole, male   DOB: 1976/01/02, 40 y.o.   MRN: [id number]

## 2016-01-25 NOTE — Progress Notes (Signed)
Patient ID: Brent Cole, male   DOB: 05/08/1975, 40 y.o.   MRN: [id number] Alert oriented and feeling well He is mobilizing quite well Physical therapy noted Will likely need home health physical therapy and arrangements for that.

## 2016-01-25 NOTE — Consult Note (Signed)
Medical Consultation   Brent Cole  JXB:[id number]  DOB: 1975-08-09  DOA: 01/18/2016  PCP: No PCP Per Patient   Outpatient Specialists: Neurosurgery   Requesting physician: Dr. Danielle Dess  Reason for consultation: Hypotension   History of Present Illness: Brent Cole is an 40 y.o. male w/ a h/o HTN, OSA, arthritis, back pain presented to [facility] one week ago for elective spinal surgery. Spinal surgery performed by Dr. Danielle Dess (bilateral laminectomy decompression L4-L5 and L5-S1 with posterior lumbar interbody arthrodesis L4-S1 with segmental fixation of L4 to the sacrum). Patient has remained hospitalized since that time undergoing intensive physical therapy and occupational therapy. Over the last day patient was noted to be hypotensive and medical consult requested. She endorses feeling somewhat lightheaded and fatigued over the last day with ambulation. Patient denies any other symptoms such as chest pain, palpitations, shortness of breath, lower extremity edema, nausea, vomiting, abdominal pain, fevers, rash, neck stiffness LOC, dizziness or vertigo. Hypertension appears to be fairly constant despite numerous blood pressure checks with various blood pressure cuffs. Heart rate remains at baseline. Denies any recent BP Rx changes prior to admission.    Review of Systems:  ROS As per HPI otherwise 10 point review of systems negative.    Past Medical History: Past Medical History:  Diagnosis Date  . ACL tear    left knee - no surgery - physical therapy  . Arthritis    left knee  . History of pneumonia as a child   . Hypertension   . Sleep apnea    Sleep apnnea currently not treated, pt. reports that he had CPAP at one time but he knows he needs to be tested  again    Past Surgical History: Past Surgical History:  Procedure Laterality Date  . Bilateral laminectomy decompression   01/2016  . NO PAST SURGERIES       Allergies:   Allergies    Allergen Reactions  . No Known Allergies      Social History:  reports that he has never smoked. He has never used smokeless tobacco. He reports that he drinks alcohol. He reports that he does not use drugs.   Family History: Family History  Problem Relation Age of Onset  . Hypertension Mother      Physical Exam: Vitals:   01/25/16 1249 01/25/16 1303 01/25/16 1324 01/25/16 1348  BP: (!) 72/26 (!) 90/41 (!) 89/44 (!) 90/48  Pulse:  96 94   Resp:      Temp:      TempSrc:      SpO2: 100%     Weight:      Height:        General:  Appears calm and comfortable Eyes:  PERRL, EOMI, normal lids, iris ENT:  grossly normal hearing, lips & tongue, mmm Neck:  no LAD, masses or thyromegaly Cardiovascular:  RRR, no m/r/g. No LE edema.  Respiratory:  CTA bilaterally, no w/r/r. Normal respiratory effort. Abdomen:  soft, ntnd, NABS Skin:  no rash or induration seen on limited exam Musculoskeletal:  grossly normal tone BUE/BLE, good ROM, no bony abnormality Psychiatric:  grossly normal mood and affect, speech fluent and appropriate, AOx3 Neurologic:  CN 2-12 grossly intact, moves all extremities in coordinated fashion, sensation intact  Data reviewed:  I have personally reviewed following labs and imaging studies Labs:  CBC:  Recent Labs Lab 01/19/16 0353  WBC 9.0  HGB  11.0*  HCT 35.1*  MCV 74.1*  PLT 175    Basic Metabolic Panel:  Recent Labs Lab 01/19/16 0353  NA 136  K 4.0  CL 100*  CO2 28  GLUCOSE 144*  BUN 16  CREATININE 1.45*  CALCIUM 8.2*   GFR Estimated Creatinine Clearance: 124.2 mL/min (by C-G formula based on SCr of 1.45 mg/dL (H)). Liver Function Tests: No results for input(s): AST, ALT, ALKPHOS, BILITOT, PROT, ALBUMIN in the last 168 hours. No results for input(s): LIPASE, AMYLASE in the last 168 hours. No results for input(s): AMMONIA in the last 168 hours. Coagulation profile No results for input(s): INR, PROTIME in the last 168  hours.  Cardiac Enzymes: No results for input(s): CKTOTAL, CKMB, CKMBINDEX, TROPONINI in the last 168 hours. BNP: Invalid input(s): POCBNP CBG: No results for input(s): GLUCAP in the last 168 hours. D-Dimer No results for input(s): DDIMER in the last 72 hours. Hgb A1c No results for input(s): HGBA1C in the last 72 hours. Lipid Profile No results for input(s): CHOL, HDL, LDLCALC, TRIG, CHOLHDL, LDLDIRECT in the last 72 hours. Thyroid function studies No results for input(s): TSH, T4TOTAL, T3FREE, THYROIDAB in the last 72 hours.  Invalid input(s): FREET3 Anemia work up No results for input(s): VITAMINB12, FOLATE, FERRITIN, TIBC, IRON, RETICCTPCT in the last 72 hours. Urinalysis    Component Value Date/Time   APPEARANCEUR Turbid (A) 12/25/2014 1309   GLUCOSEU Negative 12/25/2014 1309   BILIRUBINUR Negative 01/30/2015 1335   BILIRUBINUR Negative 12/25/2014 1309   PROTEINUR Negative 01/30/2015 1335   PROTEINUR Trace 12/25/2014 1309   UROBILINOGEN 0.2 01/30/2015 1335   NITRITE Negative 01/30/2015 1335   NITRITE Negative 12/25/2014 1309   LEUKOCYTESUR Negative 01/30/2015 1335   LEUKOCYTESUR 1+ (A) 12/25/2014 1309     Microbiology No results found for this or any previous visit (from the past 240 hour(s)).     Inpatient Medications:   Scheduled Meds: . docusate sodium  100 mg Oral BID  . senna  1 tablet Oral BID  . sodium chloride  1,000 mL Intravenous Once   Continuous Infusions: . sodium chloride       Radiological Exams on Admission: Dg Chest Port 1 View  Result Date: 01/25/2016 CLINICAL DATA:  Hypotension. Patient status post lumbar fusion 01/18/2016. EXAM: PORTABLE CHEST 1 VIEW COMPARISON:  None. FINDINGS: The lungs are clear. Heart size is normal. No pneumothorax or pleural effusion. No focal bony abnormality. IMPRESSION: Negative chest. Electronically Signed   By: Drusilla Kannerhomas  Dalessio M.D.   On: 01/25/2016 14:20    Impression/Recommendations Active Problems:    Spondylolisthesis at L4-L5 level   Hypotension   AKI (acute kidney injury) (HCC)   OSA (obstructive sleep apnea)   Hyperglycemia   Acute blood loss anemia   Spondylolisthesis at L4-L5 underwent elective surgery. POD 7. - Management per primary team, Dr. Danielle DessElsner  Hypotension: symptomatic. BP regimen continued throughout hospitalization. May be due to overall volume depletion  And innactivity given elevation in Cr suggestive of dehydration. Accurate BP reads difficult due to body habitus.  - EKG - CXR - 1L NS bolus - 24 hrs 100cc/hr NS - Manual BP reads - Hold Losartan, HCTZ and Norvasc - Hydralazine prn - AM cortisol  AKI. Cr 1.45. Baseline 1.18. Likely from surgery and overall volume depletion and continuation of ARB. - Hold losartan - IVF as above - BMET in am  OSA: Likely obesity hypoventilation related - CPAP QHS.   Hyperglycemia: 144. Anticipate some element of stress induced etiology  but cannot r/o DM yet.  - A1c - CBG ACHS  Anemia: 11 pstop. And 13.4 preop. Anticipate from blood loss. Not likely contributing to hypotension - CBC in am.    Thank you for this consultation.  [facility] hospital[facility] team will follow the patient with you.     MERRELL, DAVID J M.D. [facility] 01/25/2016, 4:19 PM

## 2016-01-26 DIAGNOSIS — R739 Hyperglycemia, unspecified: Secondary | ICD-10-CM

## 2016-01-26 DIAGNOSIS — N179 Acute kidney failure, unspecified: Secondary | ICD-10-CM

## 2016-01-26 DIAGNOSIS — I9589 Other hypotension: Secondary | ICD-10-CM

## 2016-01-26 LAB — CBC
HCT: 31.8 % — ABNORMAL LOW (ref 39.0–52.0)
Hemoglobin: 10.3 g/dL — ABNORMAL LOW (ref 13.0–17.0)
MCH: 23.5 pg — AB (ref 26.0–34.0)
MCHC: 32.4 g/dL (ref 30.0–36.0)
MCV: 72.6 fL — AB (ref 78.0–100.0)
PLATELETS: 240 10*3/uL (ref 150–400)
RBC: 4.38 MIL/uL (ref 4.22–5.81)
RDW: 14.3 % (ref 11.5–15.5)
WBC: 6.7 10*3/uL (ref 4.0–10.5)

## 2016-01-26 LAB — BASIC METABOLIC PANEL
Anion gap: 8 (ref 5–15)
BUN: 32 mg/dL — AB (ref 6–20)
CALCIUM: 8.5 mg/dL — AB (ref 8.9–10.3)
CHLORIDE: 102 mmol/L (ref 101–111)
CO2: 26 mmol/L (ref 22–32)
CREATININE: 1.63 mg/dL — AB (ref 0.61–1.24)
GFR calc non Af Amer: 50 mL/min — ABNORMAL LOW (ref >60)
GFR, EST AFRICAN AMERICAN: 59 mL/min — AB (ref >60)
Glucose, Bld: 101 mg/dL — ABNORMAL HIGH (ref 65–99)
Potassium: 4.1 mmol/L (ref 3.5–5.1)
Sodium: 136 mmol/L (ref 135–145)

## 2016-01-26 LAB — URINALYSIS, ROUTINE W REFLEX MICROSCOPIC
BILIRUBIN URINE: NEGATIVE
GLUCOSE, UA: NEGATIVE mg/dL
HGB URINE DIPSTICK: NEGATIVE
Ketones, ur: NEGATIVE mg/dL
Leukocytes, UA: NEGATIVE
Nitrite: NEGATIVE
PH: 5 (ref 5.0–8.0)
Protein, ur: NEGATIVE mg/dL
SPECIFIC GRAVITY, URINE: 1.016 (ref 1.005–1.030)

## 2016-01-26 LAB — GLUCOSE, CAPILLARY
GLUCOSE-CAPILLARY: 96 mg/dL (ref 65–99)
GLUCOSE-CAPILLARY: 121 mg/dL — AB (ref 65–99)
Glucose-Capillary: 105 mg/dL — ABNORMAL HIGH (ref 65–99)

## 2016-01-26 MED ORDER — SODIUM CHLORIDE 0.9 % IV SOLN
INTRAVENOUS | Status: AC
  Administered 2016-01-26 – 2016-01-27 (×2): via INTRAVENOUS

## 2016-01-26 NOTE — Progress Notes (Signed)
Patient ID: Brent Cole, male   DOB: 1975-08-14, 40 y.o.   MRN: [id number] Vital signs are stable Motor function appears good Patient working on plans for discharge home Hopeful for discharge in next day or so

## 2016-01-26 NOTE — Care Management Note (Signed)
Case Management Note  Patient Details  Name: Brent Cole MRN: [id number] Date of Birth: 23-Apr-1975  Subjective/Objective:                    Action/Plan: Plan is for patient to discharge home when medically ready. Pt states he will have people come and check on him at home. CM discussed with him needs for home. Pt interested in bedrails for his bed at home. CM [facility] and she states this will have to be arranged through the retail store. CM informed the patient and [facility] and they need an order for the rails. CM [facility] and he is going to send the order over to the [address] store. CM also discussed walker and 3 in 1. Pt needs both for home. Pt also given list of HH agencies for him to research. CM will f/u with him tomorrow.   Expected Discharge Date:                  Expected Discharge Plan:  Home w Home Health Services  In-House Referral:     Discharge planning Services  CM Consult  Post Acute Care Choice:    Choice offered to:     DME Arranged:    DME Agency:     HH Arranged:  RN, PT, OT HH Agency:  [facility]  Status of Service:  In process, will continue to follow  If discussed at Long Length of Stay Meetings, dates discussed:    Additional Comments:  Kermit BaloKelli F Waino Mounsey, RN 01/26/2016, 3:50 PM

## 2016-01-26 NOTE — Progress Notes (Signed)
PROGRESS NOTE    Brent Cole  ZOX:[id number] DOB: 03-24-1975 DOA: 01/18/2016 PCP: No PCP Per Patient   Outpatient Specialists:     Brief Narrative:  Brent Cole is an 40 y.o. male w/ a h/o HTN, OSA, arthritis, back pain [facility] for elective spinal surgery. Spinal surgery performed by Dr. Danielle DessElsner (bilateral laminectomy decompression L4-L5 and L5-S1 with posterior lumbar interbody arthrodesis L4-S1 with segmental fixation of L4 to the sacrum). Patient has remained hospitalized since that time undergoing intensive physical therapy and occupational therapy. Over the last day patient was noted to be hypotensive and medical consult requested. She endorses feeling somewhat lightheaded and fatigued over the last day with ambulation. Patient denies any other symptoms such as chest pain, palpitations, shortness of breath, lower extremity edema, nausea, vomiting, abdominal pain, fevers, rash, neck stiffness LOC, dizziness or vertigo. Hypertension appears to be fairly constant despite numerous blood pressure checks with various blood pressure cuffs. Heart rate remains at baseline. Denies any recent BP Rx changes prior to admission.    Assessment & Plan:   Active Problems:   Spondylolisthesis at L4-L5 level   Hypotension   AKI (acute kidney injury) (HCC)   OSA (obstructive sleep apnea)   Hyperglycemia   Acute blood loss anemia   Surgery, elective   Spondylolisthesis at L4-L5 underwent elective surgery. POD 7. - Management per primary team, Dr. Danielle DessElsner  Hypotension: symptomatic.  -much improved  AKI.  -Baseline 1.18. Likely from surgery and overall volume depletion and continuation of ARB. -IVF Bladder scan -daily BMP  OSA: Likely obesity hypoventilation related - CPAP QHS.   Hyperglycemia: 144. Anticipate some element of stress induced etiology but cannot r/o DM yet.  - A1c pending - CBG ACHS  Anemia:   13.4 preop. Anticipate from blood  loss. Not likely contributing to hypotension - CBC in am.    DVT prophylaxis:  scd  Code Status: Full Code   Family Communication: patient   Subjective: Urine still amber in color  Objective: Vitals:   01/25/16 2136 01/26/16 0104 01/26/16 0433 01/26/16 0913  BP:  (!) 99/58 128/65 127/73  Pulse:  78 74 82  Resp:  18 18 19   Temp: 98.5 F (36.9 C) 98.7 F (37.1 C) 97.5 F (36.4 C) 98.2 F (36.8 C)  TempSrc: Oral Oral Axillary Oral  SpO2:  100% 100% 100%  Weight:      Height:        Intake/Output Summary (Last 24 hours) at 01/26/16 1112 Last data filed at 01/26/16 0335  Gross per 24 hour  Intake              680 ml  Output              600 ml  Net               80 ml   Filed Weights   01/18/16 1642 01/22/16 0022  Weight: (!) 220 kg (485 lb 0.2 oz) (!) 210.9 kg (465 lb)    Examination:  General exam: Appears calm and comfortable  Respiratory system: Clear to auscultation. Respiratory effort normal. Cardiovascular system: S1 & S2 heard, RRR. No JVD, murmurs, rubs, gallops or clicks. No pedal edema. Gastrointestinal system: Abdomen is obese, soft and nontender. No organomegaly or masses felt. Normal bowel sounds heard. .     Data Reviewed: I have personally reviewed following labs and imaging studies  CBC:  Recent Labs Lab 01/26/16 0628  WBC 6.7  HGB 10.3*  HCT 31.8*  MCV 72.6*  PLT 240   Basic Metabolic Panel:  Recent Labs Lab 01/26/16 0628  NA 136  K 4.1  CL 102  CO2 26  GLUCOSE 101*  BUN 32*  CREATININE 1.63*  CALCIUM 8.5*   GFR: Estimated Creatinine Clearance: 110.5 mL/min (by C-G formula based on SCr of 1.63 mg/dL (H)). Liver Function Tests: No results for input(s): AST, ALT, ALKPHOS, BILITOT, PROT, ALBUMIN in the last 168 hours. No results for input(s): LIPASE, AMYLASE in the last 168 hours. No results for input(s): AMMONIA in the last 168 hours. Coagulation Profile: No results for input(s): INR, PROTIME in the last 168  hours. Cardiac Enzymes: No results for input(s): CKTOTAL, CKMB, CKMBINDEX, TROPONINI in the last 168 hours. BNP (last 3 results) No results for input(s): PROBNP in the last 8760 hours. HbA1C: No results for input(s): HGBA1C in the last 72 hours. CBG:  Recent Labs Lab 01/25/16 1806 01/26/16 0625  GLUCAP 141* 96   Lipid Profile: No results for input(s): CHOL, HDL, LDLCALC, TRIG, CHOLHDL, LDLDIRECT in the last 72 hours. Thyroid Function Tests: No results for input(s): TSH, T4TOTAL, FREET4, T3FREE, THYROIDAB in the last 72 hours. Anemia Panel: No results for input(s): VITAMINB12, FOLATE, FERRITIN, TIBC, IRON, RETICCTPCT in the last 72 hours. Urine analysis:    Component Value Date/Time   APPEARANCEUR Turbid (A) 12/25/2014 1309   GLUCOSEU Negative 12/25/2014 1309   BILIRUBINUR Negative 01/30/2015 1335   BILIRUBINUR Negative 12/25/2014 1309   PROTEINUR Negative 01/30/2015 1335   PROTEINUR Trace 12/25/2014 1309   UROBILINOGEN 0.2 01/30/2015 1335   NITRITE Negative 01/30/2015 1335   NITRITE Negative 12/25/2014 1309   LEUKOCYTESUR Negative 01/30/2015 1335   LEUKOCYTESUR 1+ (A) 12/25/2014 1309     )No results found for this or any previous visit (from the past 240 hour(s)).    Anti-infectives    Start     Dose/Rate Route Frequency Ordered Stop   01/18/16 2000  ceFAZolin (ANCEF) IVPB 1 g/50 mL premix     1 g 100 mL/hr over 30 Minutes Intravenous Every 8 hours 01/18/16 1641 01/19/16 0438   01/18/16 1400  ceFAZolin (ANCEF) 3 g in dextrose 5 % 50 mL IVPB  Status:  Discontinued     3 g 130 mL/hr over 30 Minutes Intravenous To Surgery 01/18/16 1351 01/18/16 1633   01/18/16 0905  bacitracin 50,000 Units in sodium chloride irrigation 0.9 % 500 mL irrigation  Status:  Discontinued       As needed 01/18/16 0905 01/18/16 1539   01/18/16 0700  ceFAZolin (ANCEF) 3 g in dextrose 5 % 50 mL IVPB     3 g 130 mL/hr over 30 Minutes [facility] 01/15/16 1058 01/18/16 1311        Radiology Studies: Dg Chest Port 1 View  Result Date: 01/25/2016 CLINICAL DATA:  Hypotension. Patient status post lumbar fusion 01/18/2016. EXAM: PORTABLE CHEST 1 VIEW COMPARISON:  None. FINDINGS: The lungs are clear. Heart size is normal. No pneumothorax or pleural effusion. No focal bony abnormality. IMPRESSION: Negative chest. Electronically Signed   By: Drusilla Kannerhomas  Dalessio M.D.   On: 01/25/2016 14:20        Scheduled Meds: . docusate sodium  100 mg Oral BID  . senna  1 tablet Oral BID   Continuous Infusions: . sodium chloride 100 mL/hr at 01/26/16 0626     LOS: 8 days    Time spent: 25 min    Millenia Waldvogel U Ezrah Dembeck, DO  [facility] Pager [phone]  If 7PM-7AM, please contact night-coverage [email or web address] Password [email or web address] 01/26/2016, 11:12 AM

## 2016-01-26 NOTE — Progress Notes (Signed)
Physical Therapy Treatment Patient Details Name: Brent Cole MRN: [id number] DOB: [birth year] Today's Date: 01/26/2016    History of Present Illness Patient is a 40 y/o male with hx of HTN, ACL tear presents s/p L4-S1 laminectomy, discectomy with fusion.    PT Comments    Pt progressing towards physical therapy goals. BP stable this session and pt with no reported orthostatic symptoms. BP checked before and after ambulation - see vitals flowsheet for details. Discussed d/c plan with pt as CIR denied admission. He states he is planning to return home with a family member short term until he can get stronger and be by himself. Pt will require bed rails as well as bariatric equipment listed below. Will continue to follow and progress as able per POC.   Follow Up Recommendations  Home health PT     Equipment Recommendations  Rolling walker with 5" wheels;3in1 (PT);Other (comment) (bariatric equipment; bed rails)    Recommendations for Other Services       Precautions / Restrictions Precautions Precautions: Back Precaution Booklet Issued: No Precaution Comments: able to recall precautions  Required Braces or Orthoses: Spinal Brace Spinal Brace: Lumbar corset Restrictions Weight Bearing Restrictions: No    Mobility  Bed Mobility               General bed mobility comments: Pt sitting up on EOB when PT arrived.   Transfers Overall transfer level: Needs assistance Equipment used: Rolling walker (2 wheeled) Transfers: Sit to/from Stand Sit to Stand: Min guard         General transfer comment: min guard for safety; increased time and effort needed for all transitional movement however no physical assist required  Ambulation/Gait Ambulation/Gait assistance: Supervision Ambulation Distance (Feet): 200 Feet Assistive device: Rolling walker (2 wheeled) Gait Pattern/deviations: Wide base of support;Step-through pattern Gait velocity: Decreased Gait velocity  interpretation: Below normal speed for age/gender General Gait Details: VC's for improved posture. Pt demonstrating good sequencing and fluid gait pattern.    Stairs            Wheelchair Mobility    Modified Rankin (Stroke Patients Only)       Balance Overall balance assessment: Needs assistance Sitting-balance support: Feet supported;Single extremity supported Sitting balance-Leahy Scale: Fair Sitting balance - Comments: Sit at EOB for donning/doffing brace   Standing balance support: Bilateral upper extremity supported Standing balance-Leahy Scale: Fair Standing balance comment: Able to stand briefly without UE support to tighten brace.                    Cognition Arousal/Alertness: Awake/alert Behavior During Therapy: WFL for tasks assessed/performed Overall Cognitive Status: Within Functional Limits for tasks assessed                      Exercises      General Comments        Pertinent Vitals/Pain Pain Assessment: Faces Faces Pain Scale: Hurts a little bit Pain Location: back and R LE with transitional movements Pain Descriptors / Indicators: Guarding;Sore Pain Intervention(s): Limited activity within patient's tolerance;Monitored during session;Repositioned    Home Living                      Prior Function            PT Goals (current goals can now be found in the care plan section) Acute Rehab PT Goals Patient Stated Goal: to get back to life PT Goal Formulation: With patient  Time For Goal Achievement: 02/02/16 Potential to Achieve Goals: Good Progress towards PT goals: Progressing toward goals    Frequency    Min 5X/week      PT Plan Discharge plan needs to be updated    Co-evaluation             End of Session Equipment Utilized During Treatment: Back brace Activity Tolerance: Patient tolerated treatment well Patient left: with call bell/phone within reach;in bed (Sitting EOB)     Time:  [id number] PT Time Calculation (min) (ACUTE ONLY): 36 min  Charges:  $Gait Training: 8-22 mins $Therapeutic Activity: 8-22 mins                    G Codes:      Marylynn PearsonLaura D Page Lancon 01/26/2016, 1:39 PM  Conni SlipperLaura Michael Ventresca, PT, DPT Acute Rehabilitation Services Pager: [phone]

## 2016-01-27 LAB — GLUCOSE, CAPILLARY
GLUCOSE-CAPILLARY: 118 mg/dL — AB (ref 65–99)
GLUCOSE-CAPILLARY: 113 mg/dL — AB (ref 65–99)
GLUCOSE-CAPILLARY: 116 mg/dL — AB (ref 65–99)
GLUCOSE-CAPILLARY: 110 mg/dL — AB (ref 65–99)
Glucose-Capillary: 93 mg/dL (ref 65–99)

## 2016-01-27 LAB — BASIC METABOLIC PANEL
Anion gap: 7 (ref 5–15)
BUN: 22 mg/dL — AB (ref 6–20)
CHLORIDE: 102 mmol/L (ref 101–111)
CO2: 26 mmol/L (ref 22–32)
CREATININE: 1.28 mg/dL — AB (ref 0.61–1.24)
Calcium: 8.4 mg/dL — ABNORMAL LOW (ref 8.9–10.3)
GFR calc Af Amer: >60 mL/min (ref >60)
GFR calc non Af Amer: >60 mL/min (ref >60)
GLUCOSE: 104 mg/dL — AB (ref 65–99)
POTASSIUM: 4.1 mmol/L (ref 3.5–5.1)
SODIUM: 135 mmol/L (ref 135–145)

## 2016-01-27 MED ORDER — AMLODIPINE BESYLATE 5 MG PO TABS
5.0000 mg | ORAL_TABLET | Freq: Every day | ORAL | Status: DC
  Administered 2016-01-27: 5 mg via ORAL
  Filled 2016-01-27 (×2): qty 1

## 2016-01-27 NOTE — Progress Notes (Signed)
Physical Therapy Treatment Patient Details Name: Brent Cole MRN: [id number] DOB: [birth year] Today's Date: 01/27/2016    History of Present Illness Patient is a 40 y/o male with hx of HTN, ACL tear presents s/p L4-S1 laminectomy, discectomy with fusion.    PT Comments    At the time of PT session pt had just finished using the bathroom and showering which required significant time to complete. Pt fatigued and declined a longer walk or stair training with PT. Pt ambulated in room and was educated on precautions, brace application/wearing schedule, and general safety at home. Will continue to follow and progress as able per POC.   Follow Up Recommendations  Home health PT     Equipment Recommendations  Rolling walker with 5" wheels;3in1 (PT) (bariatric size; bed rails)    Recommendations for Other Services       Precautions / Restrictions Precautions Precautions: Back Precaution Booklet Issued: No Precaution Comments: able to recall precautions  Required Braces or Orthoses: Spinal Brace Spinal Brace: Lumbar corset Restrictions Weight Bearing Restrictions: No    Mobility  Bed Mobility               General bed mobility comments: Pt received exiting bathroom after shower  Transfers Overall transfer level: Needs assistance Equipment used: Rolling walker (2 wheeled) Transfers: Sit to/from Stand Sit to Stand: Supervision         General transfer comment: Supervision for safety. Pt did not require hands-on guarding. No dizziness or lightheadedness reported.   Ambulation/Gait Ambulation/Gait assistance: Supervision Ambulation Distance (Feet): 10 Feet Assistive device: Rolling walker (2 wheeled) Gait Pattern/deviations: Wide base of support;Step-through pattern Gait velocity: Decreased Gait velocity interpretation: Below normal speed for age/gender General Gait Details: Pt was able to maneuver walker around room well. Ambulated out of bathroom to the recliner  chair.   Stairs         General stair comments: Pt declined  Wheelchair Mobility    Modified Rankin (Stroke Patients Only)       Balance Overall balance assessment: Needs assistance Sitting-balance support: Feet supported;No upper extremity supported Sitting balance-Leahy Scale: Fair     Standing balance support: Bilateral upper extremity supported;During functional activity Standing balance-Leahy Scale: Fair Standing balance comment: Able to stand briefly without UE support to tighten brace.                    Cognition Arousal/Alertness: Awake/alert Behavior During Therapy: WFL for tasks assessed/performed Overall Cognitive Status: Within Functional Limits for tasks assessed                      Exercises      General Comments        Pertinent Vitals/Pain Pain Assessment: Faces Faces Pain Scale: Hurts a little bit Pain Location: back and R LE with transitional movements Pain Descriptors / Indicators: Guarding;Sore Pain Intervention(s): Monitored during session    Home Living                      Prior Function            PT Goals (current goals can now be found in the care plan section) Acute Rehab PT Goals Patient Stated Goal: to get back to life PT Goal Formulation: With patient Time For Goal Achievement: 02/02/16 Potential to Achieve Goals: Good Progress towards PT goals: Progressing toward goals    Frequency    Min 5X/week      PT Plan  Current plan remains appropriate    Co-evaluation             End of Session Equipment Utilized During Treatment: Back brace Activity Tolerance: Patient tolerated treatment well Patient left: in chair;with call bell/phone within reach     Time: 1446-1456 PT Time Calculation (min) (ACUTE ONLY): 10 min  Charges:  $Gait Training: 8-22 mins                    G Codes:      Marylynn PearsonLaura D Taige Housman 01/27/2016, 3:16 PM  Conni SlipperLaura Tika Hannis, PT, DPT Acute Rehabilitation  Services Pager: [phone]

## 2016-01-27 NOTE — Progress Notes (Signed)
Patinet's Cr much improved.  [facility].  Will sign off At discharge will need: 1.  BMP 1 week with PCP along with BP check 2.  Have resumed norvasc and d/c'd HYZAAR on AVS  Marlin CanaryJessica Trishna Cwik DO

## 2016-01-27 NOTE — Progress Notes (Signed)
Pt. Checked on, will call when ready for cpap.

## 2016-01-27 NOTE — Progress Notes (Signed)
Patient ID: Brent Cole, male   DOB: 1975-03-03, 40 y.o.   MRN: [id number] Steady improvement  Plan discharge home in am.

## 2016-01-28 LAB — GLUCOSE, CAPILLARY
Glucose-Capillary: 48 mg/dL — ABNORMAL LOW (ref 65–99)
Glucose-Capillary: 108 mg/dL — ABNORMAL HIGH (ref 65–99)

## 2016-01-28 MED ORDER — METHOCARBAMOL 500 MG PO TABS: 500.0000 mg | ORAL_TABLET | Freq: Four times a day (QID) | ORAL | 3 refills | Status: DC | PRN

## 2016-01-28 MED ORDER — OXYCODONE-ACETAMINOPHEN 5-325 MG PO TABS: 1.0000 | ORAL_TABLET | ORAL | 0 refills | Status: DC | PRN

## 2016-01-28 NOTE — Discharge Summary (Addendum)
Physician Discharge Summary  Patient ID: Brent Cole MRN: [id number] DOB/AGE: 1975-08-17 40 y.o.  Admit date: 01/18/2016 Discharge date: 01/28/2016  Admission Diagnoses:Herniated nucleus pulposus L4-5 L5-S1 with spondylolisthesis, lumbar radiculopathy. Morbid obesity. Discharge Diagnoses: Herniated nucleus pulposus L4-L5 with spondylolisthesis, lumbar radiculopathy. Morbid obesity. Acute blood loss anemia. Acute kidney injury. Hyperglycemia. Active Problems:   Spondylolisthesis at L4-L5 level   Hypotension   AKI (acute kidney injury) (HCC)   OSA (obstructive sleep apnea)   Hyperglycemia   Acute blood loss anemia   Surgery, elective   Discharged Condition: fair  Hospital Course: Patient was admitted to undergo surgical decompression at L4-5 and L5-S1. He is more bitterly obese with a body mass index of 65. He tolerated surgery well but mobilization wasn't issue. Postoperatively he was noted that his creatinine had increased to 1.68. His baseline was 1.17. He had some hyperglycemia that was easily controlled. The patient was maintained in the intensive care unit during the first 2 days postoperatively then he was gradually mobilized on the floor. Because of issues with hypotension he was seen by the hospitalist is felt that he was having a reaction to his Hyzaar. This medication was stopped. His creatinine improved and on discharge it is 1.28. His been on Norvasc as his antihypertensive. At this time he is ambulatory with assistance he is being discharged home with a home health aide and some nursing help. Equipment has been provided in the form of the heavy duty rolling walker and a shower chair. He'll be seen in the office in approximately 3 weeks' time. His incision is clean and dry. He's been advised to follow-up with his primary care physician Dr. Windy Fastonald polite in approximately a week's time for recheck of his renal function and further advice regarding managing his blood  pressure.  Consults: Critical care and hospitalist  Significant Diagnostic Studies: labs: Creatinine at discharge is 1.28 high was 1.65, he had acute blood loss anemia with postoperative hemoglobin that is now 10.8 at time of discharge.  Treatments: surgery: Decompression of L4-5 and L5-S1 posterior lumbar interbody arthrodesis with fixation with pedicle screws L4 to sacrum.  Discharge Exam: Blood pressure 132/69, pulse 79, temperature 98.6 F (37 C), temperature source Oral, resp. rate 20, height 6\' 1"  (1.854 m), weight (!) 210.9 kg (465 lb), SpO2 100 %. Incision is clean and dry. Motor function is intact.  Disposition: 01-Home or Self Care      Signed: Stefani DamaLSNER,Tionna Gigante J 01/28/2016, 9:38 AM

## 2016-01-28 NOTE — Care Management Note (Signed)
Case Management Note  Patient Details  Name: Darol Destinentuan Pigeon MRN: [id number] Date of Birth: August 24, 1975  Subjective/Objective:                    Action/Plan: Pt discharging home today. [facility] CM called and informed Clydie BraunKaren of d/c today and she is going to meet Pt in the room. Pt has 3 in 1 and walker at the bedside for home. [facility] earlier in the week. CM provided the patient with the orders for him to take today when d/ced. Pt's brother at the bedside and is going to transport him home.   Expected Discharge Date:                  Expected Discharge Plan:  Home w Home Health Services  In-House Referral:     Discharge planning Services  CM Consult  Post Acute Care Choice:    Choice offered to:     DME Arranged:    DME Agency:     HH Arranged:  RN, PT, OT, Nurse's Aide HH Agency:  [facility]  Status of Service:  Completed, signed off  If discussed at Long Length of Stay Meetings, dates discussed:    Additional Comments:  Kermit BaloKelli F Lucillia Corson, RN 01/28/2016, 12:44 PM

## 2016-01-28 NOTE — Progress Notes (Signed)
PT Cancellation Note  Patient Details Name: Brent Cole MRN: [id number] DOB: [birth year]   Cancelled Treatment:    Reason Eval/Treat Not Completed: Patient declined, no reason specified. Pt reports that he is being discharged today, and does not feel he needs to work with physical therapy at this time. All questions answered. Will continue to follow until d/c.    Marylynn PearsonLaura D Shakiyla Cole 01/28/2016, 1:11 PM   Brent SlipperLaura Jayshawn Colston, PT, DPT Acute Rehabilitation Services Pager: [phone]

## 2016-02-09 ENCOUNTER — Encounter (HOSPITAL_COMMUNITY): Payer: Self-pay | Admitting: *Deleted

## 2016-02-09 DIAGNOSIS — Z79899 Other long term (current) drug therapy: Secondary | ICD-10-CM | POA: Insufficient documentation

## 2016-02-09 DIAGNOSIS — I10 Essential (primary) hypertension: Secondary | ICD-10-CM | POA: Insufficient documentation

## 2016-02-09 DIAGNOSIS — T8131XA Disruption of external operation (surgical) wound, not elsewhere classified, initial encounter: Secondary | ICD-10-CM | POA: Diagnosis not present

## 2016-02-09 DIAGNOSIS — T814XXA Infection following a procedure, initial encounter: Secondary | ICD-10-CM | POA: Diagnosis not present

## 2016-02-09 DIAGNOSIS — Y798 Miscellaneous orthopedic devices associated with adverse incidents, not elsewhere classified: Secondary | ICD-10-CM | POA: Insufficient documentation

## 2016-02-09 LAB — BASIC METABOLIC PANEL
Anion gap: 8 (ref 5–15)
BUN: 11 mg/dL (ref 6–20)
CHLORIDE: 102 mmol/L (ref 101–111)
CO2: 27 mmol/L (ref 22–32)
CREATININE: 1.07 mg/dL (ref 0.61–1.24)
Calcium: 8.8 mg/dL — ABNORMAL LOW (ref 8.9–10.3)
GFR calc Af Amer: >60 mL/min (ref >60)
GFR calc non Af Amer: >60 mL/min (ref >60)
Glucose, Bld: 110 mg/dL — ABNORMAL HIGH (ref 65–99)
Potassium: 3.6 mmol/L (ref 3.5–5.1)
SODIUM: 137 mmol/L (ref 135–145)

## 2016-02-09 LAB — CBC
HCT: 34.1 % — ABNORMAL LOW (ref 39.0–52.0)
HEMOGLOBIN: 10.7 g/dL — AB (ref 13.0–17.0)
MCH: 23.2 pg — AB (ref 26.0–34.0)
MCHC: 31.4 g/dL (ref 30.0–36.0)
MCV: 74 fL — ABNORMAL LOW (ref 78.0–100.0)
PLATELETS: 270 10*3/uL (ref 150–400)
RBC: 4.61 MIL/uL (ref 4.22–5.81)
RDW: 14.8 % (ref 11.5–15.5)
WBC: 5.3 10*3/uL (ref 4.0–10.5)

## 2016-02-09 NOTE — ED Triage Notes (Signed)
Pt had had back surgery one month ago and today he noticed some drainage from his back incision.  No fever or chills with this.  Top part of his incision seems to be draining and there is a little bit of yellowish matter that can be seen along the top of the incision.  No other s/s of infection, surrounding tissue is not swollen or red.

## 2016-02-10 ENCOUNTER — Emergency Department (HOSPITAL_COMMUNITY)
Admission: EM | Admit: 2016-02-10 | Discharge: 2016-02-10 | Disposition: A | Discharge status: Home / Self Care | Payer: Commercial Managed Care - HMO | Attending: Emergency Medicine | Admitting: Emergency Medicine

## 2016-02-10 DIAGNOSIS — T8130XA Disruption of wound, unspecified, initial encounter: Secondary | ICD-10-CM

## 2016-02-10 DIAGNOSIS — T8140XA Infection following a procedure, unspecified, initial encounter: Secondary | ICD-10-CM

## 2016-02-10 MED ORDER — SULFAMETHOXAZOLE-TRIMETHOPRIM 800-160 MG PO TABS: 1.0000 | ORAL_TABLET | Freq: Two times a day (BID) | ORAL | 0 refills | Status: AC

## 2016-02-10 MED ORDER — SULFAMETHOXAZOLE-TRIMETHOPRIM 800-160 MG PO TABS
1.0000 | ORAL_TABLET | Freq: Once | ORAL | Status: AC
  Administered 2016-02-10: 1 via ORAL
  Filled 2016-02-10: qty 1

## 2016-02-10 NOTE — ED Notes (Signed)
Pt presents with drainage from incision since Monday.  No pain, swelling, or redness to site.  Yellowish crust to wound.

## 2016-02-10 NOTE — ED Provider Notes (Signed)
[facility]-EMERGENCY DEPT Provider Note   CSN: [id number] Arrival date & time: 02/09/16  1906  By signing my name below, I, Vista Mink, attest that this documentation has been prepared under the direction and in the presence of No att. providers found. Electronically signed, Vista Mink, ED Scribe. 02/10/16. 2:29 AM.  History   Chief Complaint Chief Complaint  Patient presents with  . Post-op Problem    HPI HPI Comments: Brent Cole is a 41 y.o. male who presents to the Emergency Department for concerns of a post-op complication. Pt had lower back surgery on 01/10/16 and states concerns that his wound may have "opened" last night. He states that he noticed a small amount of pus and blood on his bed sheet where his lower back was. He denies any pain to the area. Dr. Danielle Dess performed his surgery. Pt states that he did not qualify for in home care after the surgery. He attempted to call Dr. Verlee Rossetti office but states he did not receive a response yet. He has a follow up appointment in two days that was previously scheduled prior to this issue. No fever or chills. No nausea or vomiting.  The history is provided by the patient. No language interpreter was used.    Past Medical History:  Diagnosis Date  . ACL tear    left knee - no surgery - physical therapy  . Arthritis    left knee  . History of pneumonia as a child   . Hypertension   . Sleep apnea    Sleep apnnea currently not treated, pt. reports that he had CPAP at one time but he knows he needs to be tested  again    Patient Active Problem List   Diagnosis Date Noted  . Hypotension 01/25/2016  . AKI (acute kidney injury) (HCC) 01/25/2016  . OSA (obstructive sleep apnea) 01/25/2016  . Hyperglycemia 01/25/2016  . Acute blood loss anemia 01/25/2016  . Surgery, elective   . Spondylolisthesis at L4-L5 level 01/18/2016    Past Surgical History:  Procedure Laterality Date  . Bilateral laminectomy decompression   01/2016  . NO PAST  SURGERIES         Home Medications    Prior to Admission medications   Medication Sig Start Date End Date Taking? Authorizing Provider  methocarbamol (ROBAXIN) 500 MG tablet Take 1 tablet (500 mg total) by mouth every 6 (six) hours as needed for muscle spasms. 01/28/16  Yes Barnett Abu, MD  oxyCODONE-acetaminophen (PERCOCET/ROXICET) 5-325 MG tablet Take 1-2 tablets by mouth every 4 (four) hours as needed for moderate pain. 01/28/16  Yes Barnett Abu, MD  sulfamethoxazole-trimethoprim (BACTRIM DS,SEPTRA DS) 800-160 MG tablet Take 1 tablet by mouth 2 (two) times daily. 02/10/16 02/17/16  Derwood Kaplan, MD    Family History Family History  Problem Relation Age of Onset  . Hypertension Mother     Social History Social History  Substance Use Topics  . Smoking status: Never Smoker  . Smokeless tobacco: Never Used  . Alcohol use 0.0 oz/week     Comment: socially     Allergies   No known allergies   Review of Systems Review of Systems A complete 10 system review of systems was obtained and all systems are negative except as noted in the HPI and PMH.    Physical Exam Updated Vital Signs BP (!) 151/104   Pulse 72   Temp 98.6 F (37 C) (Oral)   Resp 20   SpO2 100%   Physical Exam  Constitutional: He is oriented to person, place, and time. He appears well-developed and well-nourished. No distress.  HENT:  Head: Normocephalic and atraumatic.  Neck: Normal range of motion.  Pulmonary/Chest: Effort normal.  Neurological: He is alert and oriented to person, place, and time.  Skin: Skin is warm and dry. He is not diaphoretic.  See picture  Psychiatric: He has a normal mood and affect. Judgment normal.  Nursing note and vitals reviewed.      ED Treatments / Results  DIAGNOSTIC STUDIES: Oxygen Saturation is 100% on RA, normal by my interpretation.  COORDINATION OF CARE: 2:25 AM-Discussed treatment plan with pt at bedside and pt agreed to plan.   3:30 AM- Spoke with  Dr. Bevely Palmeritty with Neurosurgery who recommends starting antibiotics with close follow up and pt already has an appointment on Thursday.  Labs (all labs ordered are listed, but only abnormal results are displayed) Labs Reviewed  CBC - Abnormal; Notable for the following:       Result Value   Hemoglobin 10.7 (*)    HCT 34.1 (*)    MCV 74.0 (*)    MCH 23.2 (*)    All other components within normal limits  BASIC METABOLIC PANEL - Abnormal; Notable for the following:    Glucose, Bld 110 (*)    Calcium 8.8 (*)    All other components within normal limits    EKG  EKG Interpretation None       Radiology No results found.  Procedures Procedures (including critical care time)  Medications Ordered in ED Medications  sulfamethoxazole-trimethoprim (BACTRIM DS,SEPTRA DS) 800-160 MG per tablet 1 tablet (1 tablet Oral Given 02/10/16 0510)     Initial Impression / Assessment and Plan / ED Course  I have reviewed the triage vital signs and the nursing notes.  Pertinent labs & imaging results that were available during my care of the patient were reviewed by me and considered in my medical decision making (see chart for details).  Clinical Course     Pt comes in with cc of post op infection concerns. Appears that maybe there is a slight dehiscence of the wound. No signs of infection. Spoke with Neurosurgery - they requested bactrim to be started and pt get a prompt f/u.  Final Clinical Impressions(s) / ED Diagnoses   Final diagnoses:  Wound dehiscence  Postoperative infection, initial encounter    New Prescriptions Discharge Medication List as of 02/10/2016  4:54 AM    START taking these medications   Details  sulfamethoxazole-trimethoprim (BACTRIM DS,SEPTRA DS) 800-160 MG tablet Take 1 tablet by mouth 2 (two) times daily., Starting Wed 02/10/2016, Until Wed 02/17/2016, Print       Medical screening examination/treatment/procedure(s) were performed by me as the supervising  physician. Scribe service was utilized for documentation only.     Derwood KaplanAnkit Gicela Schwarting, MD 02/15/16 [phone]

## 2016-02-11 DIAGNOSIS — M4316 Spondylolisthesis, lumbar region: Secondary | ICD-10-CM | POA: Diagnosis not present

## 2016-02-17 ENCOUNTER — Encounter: Payer: Self-pay | Admitting: Physical Therapy

## 2016-02-17 ENCOUNTER — Ambulatory Visit: Payer: Commercial Managed Care - HMO | Attending: Neurological Surgery | Admitting: Physical Therapy

## 2016-02-17 DIAGNOSIS — M6281 Muscle weakness (generalized): Secondary | ICD-10-CM | POA: Diagnosis present

## 2016-02-17 DIAGNOSIS — M545 Low back pain, unspecified: Secondary | ICD-10-CM

## 2016-02-17 NOTE — Patient Instructions (Signed)
Abduction: Clam (Eccentric) - Side-Lying    Lie on side with knees bent. Lift top knee, keeping feet together. Keep trunk steady. Slowly lower for 3-5 seconds. _30__ reps per set, __2_ sets per day.   http://ecce.exer.us/64   Copyright  VHI. All rights reserved.   Log rolling - keep abdominals braced and trunk straight  [facility] [address], [address] [place], [place] [address] Phone # [phone] Fax [phone]

## 2016-02-18 NOTE — Therapy (Signed)
[facility] [address], [address] [place], [place], [address] Phone: [phone]   Fax:  [phone]  Physical Therapy Evaluation  Patient Details  Name: Brent Cole MRN: [id number] Date of Birth: Jun 20, 1975 Referring Provider: Barnett Abu, MD  Encounter Date: 02/17/2016      PT End of Session - 02/18/16 6962    Visit Number 1   Date for PT Re-Evaluation 04/14/16   PT Start Time 1532   PT Stop Time 1615   PT Time Calculation (min) 43 min   Equipment Utilized During Treatment Back brace   Activity Tolerance Patient tolerated treatment well   Behavior During Therapy [facility] for tasks assessed/performed      Past Medical History:  Diagnosis Date  . ACL tear    left knee - no surgery - physical therapy  . Arthritis    left knee  . History of pneumonia as a child   . Hypertension   . Sleep apnea    Sleep apnnea currently not treated, pt. reports that he had CPAP at one time but he knows he needs to be tested  again    Past Surgical History:  Procedure Laterality Date  . Bilateral laminectomy decompression   01/2016  . NO PAST SURGERIES      There were no vitals filed for this visit.       Subjective Assessment - 02/17/16 1542    Subjective Recently L4-5 fusion, has no pain but states he is having discomfort laying down.  Feels stiff after sitting for longer than 10 minutes.  States he hasn't been walking recently because feels unsteady going up and down the stairs to his apartment.     Patient Stated Goals Get back to activities, get into an exercise routine, be able to get up and down from the floor for work and other activities   Currently in Pain? 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   Multiple Pain Sites No            [facility] PT Assessment - 02/18/16 0001      Assessment   Medical Diagnosis M43.16   Referring Provider Barnett Abu, MD   Onset Date/Surgical Date 01/18/16   Prior Therapy no     Precautions   Precautions Back     Restrictions   Weight Bearing Restrictions No     Home Environment   Living Environment Private residence   Living Arrangements Alone   Type of Home Apartment   Home Access --  second floor apartment     Prior Function   Vocation --  not currently working, going back in 3 weeks   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,    Leisure Environmental manager, act and sing and write     Cognition   Overall Cognitive Status Within Functional Limits for tasks assessed     Observation/Other Assessments   Focus on Therapeutic Outcomes (FOTO)  53% limitation     Posture/Postural Control   Posture/Postural Control Postural limitations   Postural Limitations Decreased lumbar lordosis;Weight shift right  AROM   Overall AROM Comments 50% limitation in lumbar AROM due to back precautions     Strength   Right Hip Extension 4-/5   Right Hip External Rotation  4/5   Right Hip ABduction 4/5   Right Hip ADduction 4+/5   Left Hip Extension 4-/5   Left Hip External Rotation 4-/5   Left Hip ABduction 4-/5   Left Hip ADduction 4+/5     Transfers   Transfers Sit to Stand  difficulty maintaining core control, some stress in low back   Comments needs cues for correct body mechanics     Ambulation/Gait   Gait Pattern Trendelenburg  (on Lt > Rt)     Balance   Balance Assessed --  SLS - 5 sec Lt LE, 10+ sec Rt LE                   [facility] Adult PT Treatment/Exercise - 02/18/16 0001      Self-Care   Self-Care ADL's   ADL's log rolling with abdominal bracing for in and out of bed      Lumbar Exercises: Sidelying   Clam 20 reps                PT Education - 02/18/16 [id number]    Education provided Yes   Education Details clamshells and log rolling   Person(s) Educated Patient   Methods Explanation;Tactile cues;Verbal cues;Handout   Comprehension Verbalized understanding          PT Short Term Goals - 02/18/16 0915      PT SHORT TERM GOAL #1   Title be independent with initial HEP   Time 4   Period Weeks   Status New     PT SHORT TERM GOAL #2   Title able to get in and out of bed without using hand rails using log roll correctly   Time 4   Period Weeks   Status New     PT SHORT TERM GOAL #3   Title report 50% less pain during functional sit to stand transfers   Time 4   Period Weeks   Status New     PT SHORT TERM GOAL #4   Title be able to walk at least 10 minutes per day for returning to exercise program   Time 4   Period Weeks   Status New           PT Long Term Goals - 02/18/16 [id number]      PT LONG TERM GOAL #1   Title be independent with advance HEP   Time 8   Period Weeks   Status New     PT LONG TERM GOAL #2   Title FOTO < or = to 43% limitation   Time 8   Period Weeks   Status New     PT LONG TERM GOAL #3   Title be and to demonstrate good core control in half kneeling position and be able to maintain neutral spine getting up and down from floor   Time 8   Period Weeks   Status New     PT LONG TERM GOAL #4   Title reports feeling stable when ascending/descending stairs to his apartment   Time 8   Period Weeks   Status New               Plan - 02/18/16 0840    Clinical Impression Statement Pt presents to clnic s/p spinal fusion and laminectomy  L4-S1.  Pt condition is stable so low complexity evaluation completed.  Pt is morbidly obese and presents with core and hip weakness as expressed with Trendelenburg gait, MMT 4-/5 to 4/5 of bilateral LE, difficulty with bed mobility and difficulty with sit to stand. Pt  has dercreased lumbar ROM currently due to pain and back precautions post surgery.  Pt unable to perform single leg stand Lt LE for >5 sec.  He is currenlty experiencing surgical pain at incision of 3/10.  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   Rehab Potential Excellent   Clinical Impairments Affecting Rehab Potential obesity, s/p lumbar fusion and laminectomy L4-S1   PT Frequency 2x / week   PT Duration 8 weeks   PT Treatment/Interventions ADLs/Self Care Home Management;Cryotherapy;Electrical Stimulation;Moist Heat;Gait training;Stair training;Functional mobility training;Therapeutic activities;Therapeutic exercise;Balance training;Neuromuscular re-education;Patient/family education;Manual techniques;Scar mobilization;Taping   PT Next Visit Plan f/u on log rolling, abdominal bracing, f/u on walking routine, core and LE strengthening, sit to stand   PT Home Exercise Plan progress as needed   Recommended Other Services none   Consulted and Agree with Plan of Care Patient      Patient will benefit from skilled therapeutic intervention in order to improve the following deficits and impairments:  Abnormal gait, Decreased activity tolerance, Decreased balance, Decreased strength, Decreased endurance, Impaired perceived functional ability, Improper body mechanics, Postural dysfunction, Pain, Obesity  Visit Diagnosis: Acute midline low back pain without sciatica  Muscle weakness (generalized)     Problem List Patient Active Problem List   Diagnosis Date Noted  . Hypotension 01/25/2016  . AKI (acute kidney injury) (HCC) 01/25/2016  . OSA (obstructive sleep apnea) 01/25/2016  . Hyperglycemia 01/25/2016  . Acute blood loss anemia 01/25/2016  . Surgery, elective   . Spondylolisthesis at L4-L5 level 01/18/2016    Vincente Poli, PT 02/18/2016, 9:23 AM  [facility] [address], [address] [place], [place], [address] Phone: [phone]   Fax:  [phone]  Name: Brent Cole MRN: [id number] Date of Birth: 1975-09-09

## 2016-02-23 ENCOUNTER — Encounter: Payer: Self-pay | Admitting: Physical Therapy

## 2016-02-23 ENCOUNTER — Ambulatory Visit: Payer: Commercial Managed Care - HMO | Admitting: Physical Therapy

## 2016-02-23 DIAGNOSIS — M545 Low back pain, unspecified: Secondary | ICD-10-CM

## 2016-02-23 DIAGNOSIS — I10 Essential (primary) hypertension: Secondary | ICD-10-CM | POA: Diagnosis not present

## 2016-02-23 DIAGNOSIS — M6281 Muscle weakness (generalized): Secondary | ICD-10-CM

## 2016-02-23 NOTE — Therapy (Signed)
[facility] [address], [address] [place], [place], [address] Phone: [phone]   Fax:  [phone]  Physical Therapy Treatment  Patient Details  Name: Brent Cole MRN: [id number] Date of Birth: 01-04-1976 Referring Provider: Barnett AbuHenry Elsner, MD  Encounter Date: 02/23/2016      PT End of Session - 02/23/16 1246    Visit Number 2   Date for PT Re-Evaluation 04/14/16   PT Start Time 1242   PT Stop Time 1314   PT Time Calculation (min) 32 min   Equipment Utilized During Treatment Back brace   Activity Tolerance Patient tolerated treatment well   Behavior [facility] for tasks assessed/performed      Past Medical History:  Diagnosis Date  . ACL tear    left knee - no surgery - physical therapy  . Arthritis    left knee  . History of pneumonia as a child   . Hypertension   . Sleep apnea    Sleep apnnea currently not treated, pt. reports that he had CPAP at one time but he knows he needs to be tested  again    Past Surgical History:  Procedure Laterality Date  . Bilateral laminectomy decompression   01/2016  . NO PAST SURGERIES      There were no vitals filed for this visit.      Subjective Assessment - 02/23/16 1245    Subjective Pt reports back feeling ok today. Denies pain at the moment.   Patient Stated Goals Get back to activities, get into an exercise routine, be able to get up and down from the floor for work and other activities   Currently in Pain? No/denies   Pain Score 0-No pain                         [facility] Adult PT Treatment/Exercise - 02/23/16 0001      Lumbar Exercises: Aerobic   UBE (Upper Arm Bike) Nustep L1 x 6 minutes  Seat 15 legs only, Therapist present to discuss treatment     Lumbar Exercises: Seated   Long Arc Quad on Chair Strengthening;Both;1 set;10 reps  #2   Sit to Stand 10 reps  Posture education and squat technique     Lumbar Exercises: Supine    Ab Set 10 reps   Heel Slides 10 reps   Bridge 10 reps  Unable to clear mat   Other Supine Lumbar Exercises Seated ball squeeze with 3 second holds x10                PT Education - 02/23/16 1319    Education provided Yes   Education Details Posture   Person(s) Educated Patient   Methods Explanation;Handout;Demonstration   Comprehension Verbalized understanding          PT Short Term Goals - 02/23/16 1246      PT SHORT TERM GOAL #1   Title be independent with initial HEP   Time 4   Period Weeks   Status On-going     PT SHORT TERM GOAL #2   Title able to get in and out of bed without using hand rails using log roll correctly   Time 4   Period Weeks   Status On-going     PT SHORT TERM GOAL #3   Title report 50% less pain during functional sit to stand transfers   Time 4   Period Weeks   Status On-going  PT SHORT TERM GOAL #4   Title be able to walk at least 10 minutes per day for returning to exercise program   Time 4   Period Weeks   Status On-going           PT Long Term Goals - 02/23/16 1247      PT LONG TERM GOAL #1   Title be independent with advance HEP   Time 8   Period Weeks   Status On-going     PT LONG TERM GOAL #2   Title FOTO < or = to 43% limitation   Time 8   Period Weeks   Status On-going     PT LONG TERM GOAL #3   Title be and to demonstrate good core control in half kneeling position and be able to maintain neutral spine getting up and down from floor   Time 8   Period Weeks   Status On-going     PT LONG TERM GOAL #4   Title reports feeling stable when ascending/descending stairs to his apartment   Time 8   Period Weeks   Status On-going               Plan - 02/23/16 1314    Clinical Impression Statement Pt presents to clinic with trendelumberg gait pattern and wearing back brace. Pt is 5 weeks s/p spinal fusion and laminectomy L4-S1. Pt demonstrates for form with log roll to sit to supine. Decrease LE  strength with sit to stand and walking. Pt is a singer and performer and has good awarness of abdominals and diaphram. Able to tolerate all exercises well with some fatigue. Pt will continue to benefit from skilled therpy for LE strength and core stability.    Rehab Potential Excellent   Clinical Impairments Affecting Rehab Potential obesity, s/p lumbar fusion and laminectomy L4-S1   PT Frequency 2x / week   PT Duration 8 weeks   PT Treatment/Interventions ADLs/Self Care Home Management;Cryotherapy;Electrical Stimulation;Moist Heat;Gait training;Stair training;Functional mobility training;Therapeutic activities;Therapeutic exercise;Balance training;Neuromuscular re-education;Patient/family education;Manual techniques;Scar mobilization;Taping   PT Next Visit Plan Sit-stand reveiw, core and LE strengthening   Consulted and Agree with Plan of Care Patient      Patient will benefit from skilled therapeutic intervention in order to improve the following deficits and impairments:  Abnormal gait, Decreased activity tolerance, Decreased balance, Decreased strength, Decreased endurance, Impaired perceived functional ability, Improper body mechanics, Postural dysfunction, Pain, Obesity  Visit Diagnosis: Acute midline low back pain without sciatica  Muscle weakness (generalized)     Problem List Patient Active Problem List   Diagnosis Date Noted  . Hypotension 01/25/2016  . AKI (acute kidney injury) (HCC) 01/25/2016  . OSA (obstructive sleep apnea) 01/25/2016  . Hyperglycemia 01/25/2016  . Acute blood loss anemia 01/25/2016  . Surgery, elective   . Spondylolisthesis at L4-L5 level 01/18/2016    Dessa Phi PTA 02/23/2016, 1:19 PM  [facility] [address] [place], [place], [address] Phone: [phone]   Fax:  [phone]  Name: Brent Cole MRN: [id number] Date of Birth: 04/02/1975

## 2016-02-23 NOTE — Patient Instructions (Signed)

## 2016-02-25 ENCOUNTER — Encounter: Payer: Self-pay | Admitting: Physical Therapy

## 2016-03-01 ENCOUNTER — Ambulatory Visit: Payer: Commercial Managed Care - HMO | Admitting: Physical Therapy

## 2016-03-01 DIAGNOSIS — M6281 Muscle weakness (generalized): Secondary | ICD-10-CM

## 2016-03-01 DIAGNOSIS — M545 Low back pain, unspecified: Secondary | ICD-10-CM

## 2016-03-01 NOTE — Patient Instructions (Signed)
   Lying on back:  Abdominal brace with heel slides, bent knee fallouts, ball squeezes  Seated abdominal brace with hip to hip, hip to shoulder, shoulder to shoulder, ear to ear  30 sec each   Calf stretch against wall hold 20 sec 3x    Wall push ups 10x   Heel and toe raises 10x  Lavinia SharpsStacy Nella Botsford PT [facility] [address], [address] [place], [place] [address] Phone # [phone] Fax [phone]

## 2016-03-01 NOTE — Therapy (Signed)
[facility] [address] [place], [place], [address] Phone: [phone]   Fax:  [phone]  Physical Therapy Treatment  Patient Details  Name: Brent Cole MRN: [id number] Date of Birth: April 19, 1975 Referring Provider: Barnett Abu, MD  Encounter Date: 03/01/2016      PT End of Session - 03/01/16 1545    Visit Number 3   Date for PT Re-Evaluation 04/14/16   PT Start Time 1455   PT Stop Time 1543   PT Time Calculation (min) 48 min   Activity Tolerance Patient tolerated treatment well      Past Medical History:  Diagnosis Date  . ACL tear    left knee - no surgery - physical therapy  . Arthritis    left knee  . History of pneumonia as a child   . Hypertension   . Sleep apnea    Sleep apnnea currently not treated, pt. reports that he had CPAP at one time but he knows he needs to be tested  again    Past Surgical History:  Procedure Laterality Date  . Bilateral laminectomy decompression   01/2016  . NO PAST SURGERIES      There were no vitals filed for this visit.      Subjective Assessment - 03/01/16 1458    Subjective No pain today.  Left LE weakness persists.     Currently in Pain? No/denies   Pain Score 0-No pain                         [facility] Adult PT Treatment/Exercise - 03/01/16 0001      Ambulation/Gait   Gait Comments 3 min indoor     Lumbar Exercises: Stretches   Lobbyist Limitations calf stretch 3x 20 sec right and left  against wall     Lumbar Exercises: Standing   Heel Raises 10 reps   Other Standing Lumbar Exercises wall push ups 10x     Lumbar Exercises: Seated   Sit to Stand Limitations seated 2# plyo ball hip to hip, shoulder  to shoulder, ear to ear 30 sec each     Lumbar Exercises: Supine   Ab Set 10 reps  with wedge to elevate head   Heel Slides 10 reps  with wedge   Other Supine Lumbar Exercises  ball squeeze with 3 second holds x10   Other  Supine Lumbar Exercises UE raises with abdominal brace 10x                PT Education - 03/01/16 1544    Education provided Yes   Education Details low level core and mobility ex   Person(s) Educated Patient   Methods Explanation;Demonstration;Handout   Comprehension Verbalized understanding;Returned demonstration          PT Short Term Goals - 03/01/16 1552      PT SHORT TERM GOAL #1   Title be independent with initial HEP   Time 4   Period Weeks   Status On-going     PT SHORT TERM GOAL #2   Title able to get in and out of bed without using hand rails using log roll correctly   Time 4   Period Weeks   Status On-going     PT SHORT TERM GOAL #3   Title report 50% less pain during functional sit to stand transfers   Time 4   Period Weeks   Status On-going  PT SHORT TERM GOAL #4   Title be able to walk at least 10 minutes per day for returning to exercise program   Time 4   Period Weeks   Status On-going           PT Long Term Goals - 03/01/16 1552      PT LONG TERM GOAL #1   Title be independent with advance HEP   Time 8   Period Weeks   Status On-going     PT LONG TERM GOAL #2   Title FOTO < or = to 43% limitation   Time 8   Period Weeks   Status On-going     PT LONG TERM GOAL #3   Title be and to demonstrate good core control in half kneeling position and be able to maintain neutral spine getting up and down from floor   Time 8   Period Weeks   Status On-going     PT LONG TERM GOAL #4   Title reports feeling stable when ascending/descending stairs to his apartment   Time 8   Period Weeks   Status On-going               Plan - 03/01/16 1546    Clinical Impression Statement The patient presents without his back brace today.  He states he has been doing his home exercises but has not done much walking yet.  He is able to participate in low level core and mobility exercises in supine, sitting and standing without production of  back pain but with general fatigue reported.  He is quite fatigued with ambulation in the clinic for 3 minutes.  Verbal cues for abdominal brace and to avoid holding his breath.  Therapist closely monitoring response with all treatment interventions.     PT Next Visit Plan Sit-stand review, core and  left LE strengthening;  progressive walking program      Patient will benefit from skilled therapeutic intervention in order to improve the following deficits and impairments:     Visit Diagnosis: Acute midline low back pain without sciatica  Muscle weakness (generalized)     Problem List Patient Active Problem List   Diagnosis Date Noted  . Hypotension 01/25/2016  . AKI (acute kidney injury) (HCC) 01/25/2016  . OSA (obstructive sleep apnea) 01/25/2016  . Hyperglycemia 01/25/2016  . Acute blood loss anemia 01/25/2016  . Surgery, elective   . Spondylolisthesis at L4-L5 level 01/18/2016    Lavinia SharpsStacy Bridie Colquhoun, PT 03/01/16 3:54 PM Phone: [phone] Fax: [phone] Vivien PrestoSimpson, Satia Winger C 03/01/2016, 3:53 PM  [facility] [address], [address] [place], [place], [address] Phone: [phone]   Fax:  [phone]  Name: Brent Cole MRN: [id number] Date of Birth: Jul 07, 1975

## 2016-03-08 ENCOUNTER — Ambulatory Visit: Payer: Commercial Managed Care - HMO | Admitting: Physical Therapy

## 2016-03-08 DIAGNOSIS — M6281 Muscle weakness (generalized): Secondary | ICD-10-CM

## 2016-03-08 DIAGNOSIS — M545 Low back pain, unspecified: Secondary | ICD-10-CM

## 2016-03-08 NOTE — Therapy (Signed)
[facility] [address] [place], [place], [address] Phone: [phone]   Fax:  [phone]  Physical Therapy Treatment  Patient Details  Name: Brent Cole MRN: [id number] Date of Birth: 10/15/1975 Referring Provider: Barnett Abu, MD  Encounter Date: 03/08/2016      PT End of Session - 03/08/16 1524    Visit Number 4   Date for PT Re-Evaluation 04/14/16   PT Start Time 1450   PT Stop Time 1530   PT Time Calculation (min) 40 min   Activity Tolerance Patient tolerated treatment well      Past Medical History:  Diagnosis Date  . ACL tear    left knee - no surgery - physical therapy  . Arthritis    left knee  . History of pneumonia as a child   . Hypertension   . Sleep apnea    Sleep apnnea currently not treated, pt. reports that he had CPAP at one time but he knows he needs to be tested  again    Past Surgical History:  Procedure Laterality Date  . Bilateral laminectomy decompression   01/2016  . NO PAST SURGERIES      There were no vitals filed for this visit.      Subjective Assessment - 03/08/16 1450    Subjective States he did fine after last visit.  States he [place] at the mall for about an hour.    I have some morning discomfort when I get out of bed, aches with a deep breath.     Currently in Pain? No/denies   Pain Score 0-No pain                         [facility] Adult PT Treatment/Exercise - 03/08/16 0001      Lumbar Exercises: Stretches   Standing Extension Limitations doorway stretch with and without UE movements 3x 5 each     Lumbar Exercises: Standing   Heel Raises 10 reps   Other Standing Lumbar Exercises wall push ups 10x   Other Standing Lumbar Exercises step ups 5x right and left     Lumbar Exercises: Seated   Sit to Stand Limitations seated 2# plyo ball hip to hip, shoulder  to shoulder, ear to ear 30 sec each     Lumbar Exercises: Supine   Ab Set 10 reps   with wedge to elevate head   Heel Slides 10 reps  with wedge and added UE raises   Isometric Hip Flexion 10 reps   Other Supine Lumbar Exercises  ball squeeze with 3 second holds x10   Other Supine Lumbar Exercises hip abduction/external rotation with red band 10x     Shoulder Exercises: Seated   Extension Strengthening;Both;15 reps   Theraband Level (Shoulder Extension) Level 3 (Green)   Row Strengthening;Both;15 reps;Theraband   Theraband Level (Shoulder Row) Level 3 Ch[place] Si)                PT Education - 03/08/16 1530    Education provided Yes   Education Details ab brace with hand to knee push;  wall slides;  doorway stretch   Person(s) Educated Patient   Methods Explanation;Demonstration;Handout   Comprehension Verbalized understanding;Returned demonstration          PT Short Term Goals - 03/08/16 1537      PT SHORT TERM GOAL #1   Title be independent with initial HEP   Status Achieved  PT SHORT TERM GOAL #2   Title able to get in and out of bed without using hand rails using log roll correctly   Status Achieved     PT SHORT TERM GOAL #3   Title report 50% less pain during functional sit to stand transfers   Time 4   Period Weeks   Status On-going     PT SHORT TERM GOAL #4   Title be able to walk at least 10 minutes per day for returning to exercise program   Time 4   Period Weeks   Status On-going           PT Long Term Goals - 03/08/16 1538      PT LONG TERM GOAL #1   Title be independent with advance HEP   Time 8   Period Weeks   Status On-going     PT LONG TERM GOAL #2   Title FOTO < or = to 43% limitation   Time 8   Period Weeks   Status On-going     PT LONG TERM GOAL #3   Title be and to demonstrate good core control in half kneeling position and be able to maintain neutral spine getting up and down from floor   Time 8   Period Weeks   Status On-going     PT LONG TERM GOAL #4   Title reports feeling stable when  ascending/descending stairs to his apartment   Time 8   Period Weeks   Status On-going               Plan - 03/08/16 1530    Clinical Impression Statement The patient continues to progress with functional reactivation, core and LE strengthening.  He is deconditioned from his long term back issue as well as obesity.  Improved activation of transverse abdominals with fewer cues to avoid holding his breath.  Fatigues quickly with standing exercise.  He declines ambulation for endurance in clinic preferring to do on his own.  He has a goal to work up to 1 mile.  Progressing with STGs.    Therapist closely monitoring response with all treatment interventions.     PT Next Visit Plan  core and  left LE strengthening;  progressive walking program      Patient will benefit from skilled therapeutic intervention in order to improve the following deficits and impairments:     Visit Diagnosis: Acute midline low back pain without sciatica  Muscle weakness (generalized)     Problem List Patient Active Problem List   Diagnosis Date Noted  . Hypotension 01/25/2016  . AKI (acute kidney injury) (HCC) 01/25/2016  . OSA (obstructive sleep apnea) 01/25/2016  . Hyperglycemia 01/25/2016  . Acute blood loss anemia 01/25/2016  . Surgery, elective   . Spondylolisthesis at L4-L5 level 01/18/2016   Lavinia SharpsStacy Madylyn Insco, PT 03/08/16 3:40 PM Phone: [phone] Fax: [phone] Vivien PrestoSimpson, Lilya Smitherman C 03/08/2016, 3:39 PM   [facility] [address] [place], [place], [address] Phone: [phone]   Fax:  [phone]  Name: Darol Destinentuan Villacres MRN: [id number] Date of Birth: Apr 26, 1975

## 2016-03-08 NOTE — Patient Instructions (Signed)
Stacy Simpson PT [facility] [address], [address] [place], [place] [address] Phone # [phone] Fax [phone]    

## 2016-03-15 ENCOUNTER — Ambulatory Visit: Payer: Commercial Managed Care - HMO | Attending: Neurological Surgery | Admitting: Physical Therapy

## 2016-03-15 DIAGNOSIS — M545 Low back pain, unspecified: Secondary | ICD-10-CM

## 2016-03-15 DIAGNOSIS — M6281 Muscle weakness (generalized): Secondary | ICD-10-CM

## 2016-03-15 NOTE — Therapy (Addendum)
[facility] [address] [place], [place], [address] Phone: [phone]   Fax:  [phone]  Physical Therapy Treatment/Discharge Summary  Patient Details  Name: Brent Cole MRN: [id number] Date of Birth: 1975/10/15 Referring Provider: Kristeen Miss, MD  Encounter Date: 03/15/2016      PT End of Session - 03/15/16 1528    Visit Number 5   Date for PT Re-Evaluation 04/14/16   PT Start Time 1450   PT Stop Time 1535   PT Time Calculation (min) 45 min   Activity Tolerance Patient tolerated treatment well      Past Medical History:  Diagnosis Date  . ACL tear    left knee - no surgery - physical therapy  . Arthritis    left knee  . History of pneumonia as a child   . Hypertension   . Sleep apnea    Sleep apnnea currently not treated, pt. reports that he had CPAP at one time but he knows he needs to be tested  again    Past Surgical History:  Procedure Laterality Date  . Bilateral laminectomy decompression   01/2016  . NO PAST SURGERIES      There were no vitals filed for this visit.      Subjective Assessment - 03/15/16 1453    Subjective Did fine after last visit.   Denies pain today.  No LE symptoms today but had some there yesterday.  Overall less frequent symptoms.     Currently in Pain? No/denies   Pain Score 0-No pain   Pain Location Back   Pain Orientation Lower   Pain Type Surgical pain   Pain Onset More than a month ago   Pain Frequency Intermittent   Aggravating Factors  being on feet too long   Pain Relieving Factors lying down propped up                         [facility] Adult PT Treatment/Exercise - 03/15/16 0001      Lumbar Exercises: Stretches   Active Hamstring Stretch Limitations seated neural flossing 10x right and left    Standing Extension Limitations doorway stretch with and without UE movements 3x 5 each     Lumbar Exercises: Standing   Row  Strengthening;Power tower;Both;20 reps   Theraband Level (Row) --  15#   Shoulder Extension Strengthening;Both;20 reps;Theraband   Theraband Level (Shoulder Extension) --  red band   Other Standing Lumbar Exercises wall planks 15x   Other Standing Lumbar Exercises red band UE diagonals extensions 15x right and left     Lumbar Exercises: Seated   Sit to Stand 10 reps   Sit to Stand Limitations from high table     Lumbar Exercises: Supine   Ab Set 10 reps  with wedge to elevate head   Isometric Hip Flexion 10 reps   Other Supine Lumbar Exercises  ball squeeze with 3 second holds x10   Other Supine Lumbar Exercises hip abduction/external rotation with green  band 10x     Knee/Hip Exercises: Standing   Hip Extension Stengthening;Right;Left;15 reps                  PT Short Term Goals - 03/15/16 1543      PT SHORT TERM GOAL #1   Title be independent with initial HEP     PT SHORT TERM GOAL #2   Title able to get in and out of bed  without using hand rails using log roll correctly   Status Achieved     PT SHORT TERM GOAL #3   Title report 50% less pain during functional sit to stand transfers   Time 4   Period Weeks   Status On-going     PT SHORT TERM GOAL #4   Title be able to walk at least 10 minutes per day for returning to exercise program   Time 4   Period Weeks   Status On-going           PT Long Term Goals - 03/15/16 1544      PT LONG TERM GOAL #1   Title be independent with advance HEP   Time 8   Period Weeks   Status On-going     PT LONG TERM GOAL #2   Title FOTO < or = to 43% limitation   Time 8   Period Weeks   Status On-going     PT LONG TERM GOAL #3   Title be and to demonstrate good core control in half kneeling position and be able to maintain neutral spine getting up and down from floor   Time 8   Period Weeks   Status On-going     PT LONG TERM GOAL #4   Title reports feeling stable when ascending/descending stairs to his  apartment   Time 8   Period Weeks   Status On-going               Plan - 03/15/16 1538    Clinical Impression Statement The patient reports he has not walked over the weekend.  Encouraged a consistent walking program with a gradual progression to build his aerobic capacity.  The patient reports general fatigue with exercises.    No reports of pain, numbness or tingling with exercises including neural flossing in seated position.  Decreased ankle dorsiflexion motor control persists.   The patient continues to progress with gradual functional reactivation.  Recommend continued skilled PT for appropriate progression s/p surgical fusion.  Therapist closely monitoring response with all treatment interventions.     PT Next Visit Plan  core and  left LE strengthening;  progressive walking program      Patient will benefit from skilled therapeutic intervention in order to improve the following deficits and impairments:     Visit Diagnosis: Acute midline low back pain without sciatica  Muscle weakness (generalized)    PHYSICAL THERAPY DISCHARGE SUMMARY  Visits from Start of Care: 5  Current functional level related to goals / functional outcomes: The patient did not return after last scheduled appt.  His chart has been inactive > 6 months.  Will discharge from PT at this time.    Remaining deficits: As above   Education / Equipment: Basic HEP Plan: Patient agrees to discharge.  Patient goals were not met. Patient is being discharged due to not returning since the last visit.  ?????          Problem List Patient Active Problem List   Diagnosis Date Noted  . Hypotension 01/25/2016  . AKI (acute kidney injury) ([place]) 01/25/2016  . OSA (obstructive sleep apnea) 01/25/2016  . Hyperglycemia 01/25/2016  . Acute blood loss anemia 01/25/2016  . Surgery, elective   . Spondylolisthesis at L4-L5 level 01/18/2016   Ruben Im, PT 03/15/16 3:46 PM Phone: [phone] Fax:  [phone]  Alvera Singh 03/15/2016, 3:45 PM  [facility] [address], [address] [place],  [place], [address] Phone: [phone]   Fax:  [phone]  Name: Cuong Moorman MRN: [id number] Date of Birth: 1975/04/07

## 2016-04-13 DIAGNOSIS — M4316 Spondylolisthesis, lumbar region: Secondary | ICD-10-CM | POA: Diagnosis not present

## 2016-11-19 IMAGING — MR MR LUMBAR SPINE W/O CM
4 of 5 series · 18 of 48 positions shown · non-contrast
Comparison: None available.

CLINICAL DATA: Initial evaluation for low back pain with leg pain
for several weeks. No trauma. Pain greater on left side with
numbness in left leg ache.

EXAM:
MRI LUMBAR SPINE WITHOUT CONTRAST
TECHNIQUE: Multiplanar, multisequence MR imaging of the lumbar spine was
performed. No intravenous contrast was administered.

[Series 6: T2 · sagittal · 4.0mm · 0.78mm/px · 6 of 15 slices shown (1 of 2)]
[im 1/15]
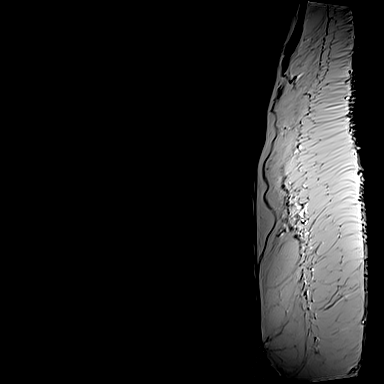
[im 3/15]
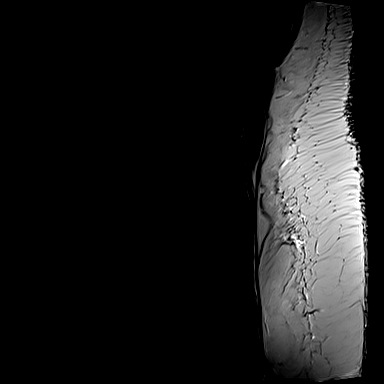
[im 6/15]
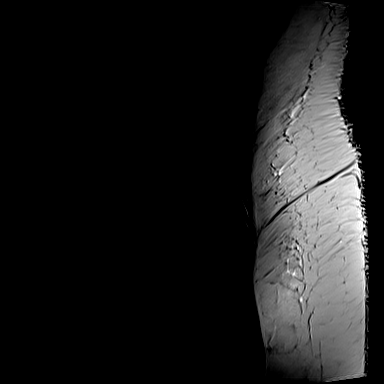
[im 9/15]
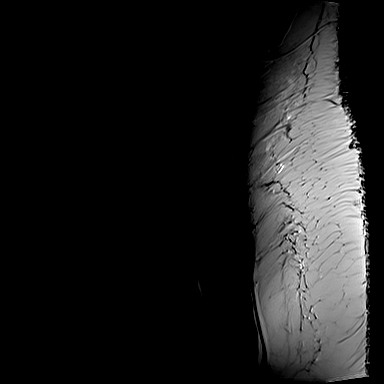
[im 12/15]
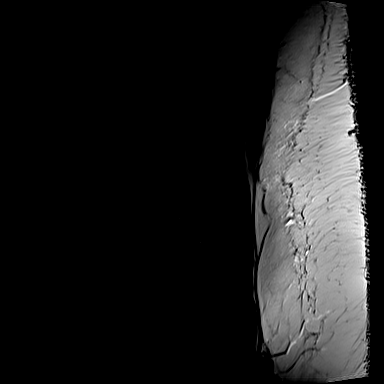
[im 15/15]
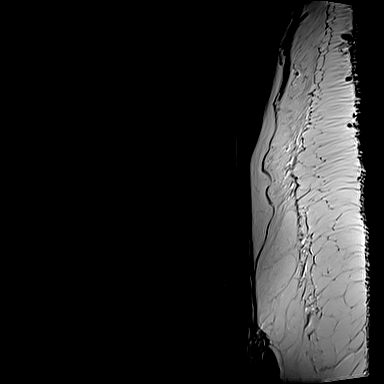

[Series 7: T1 · sagittal · 4.0mm · 0.78mm/px · 3 of 15 slices shown (1 of 2)]
[im 3/15]
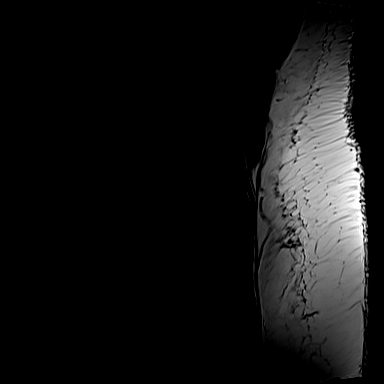
[im 9/15]
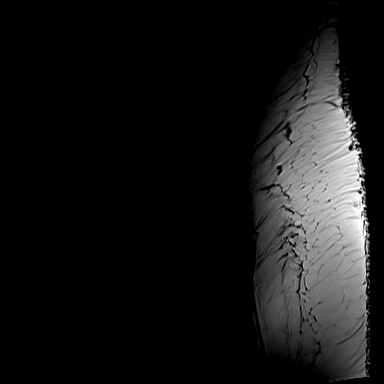
[im 15/15]
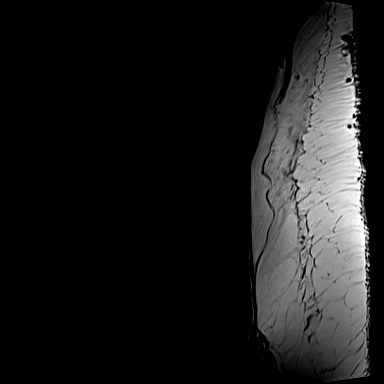

[Series 11: T1 · axial · 4.0mm · 0.31mm/px · z∈[-17,+127]mm · 3 of 36 slices shown (2 of 2)]
[im 6/36]
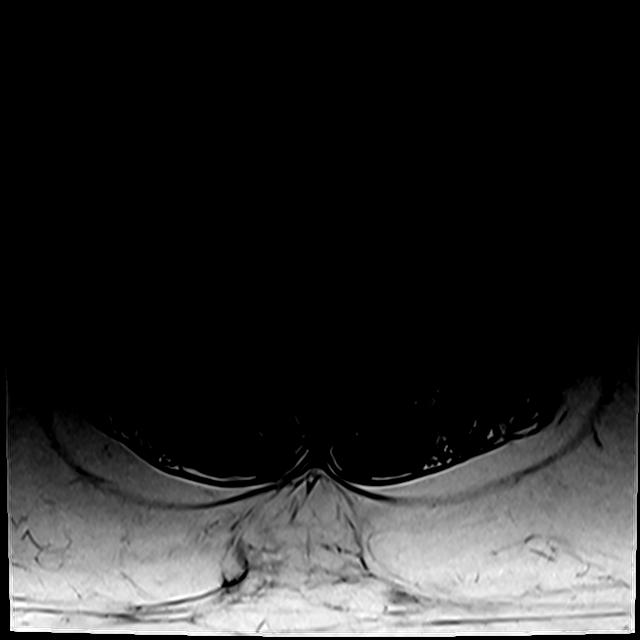
[im 18/36]
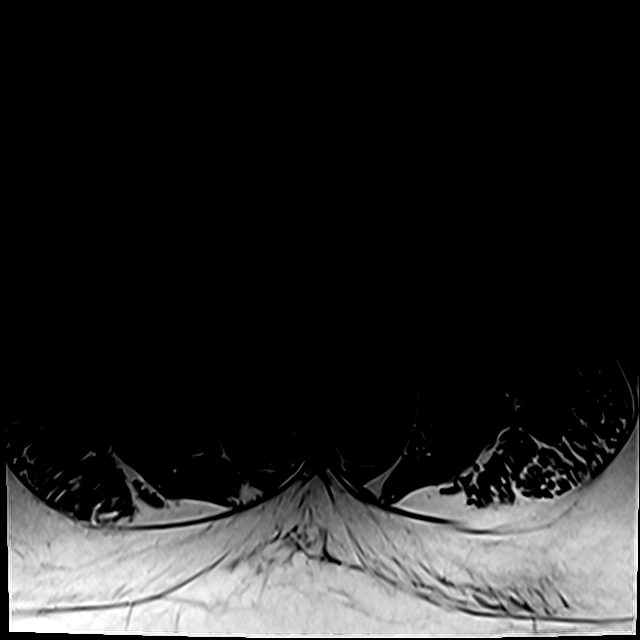
[im 31/36]
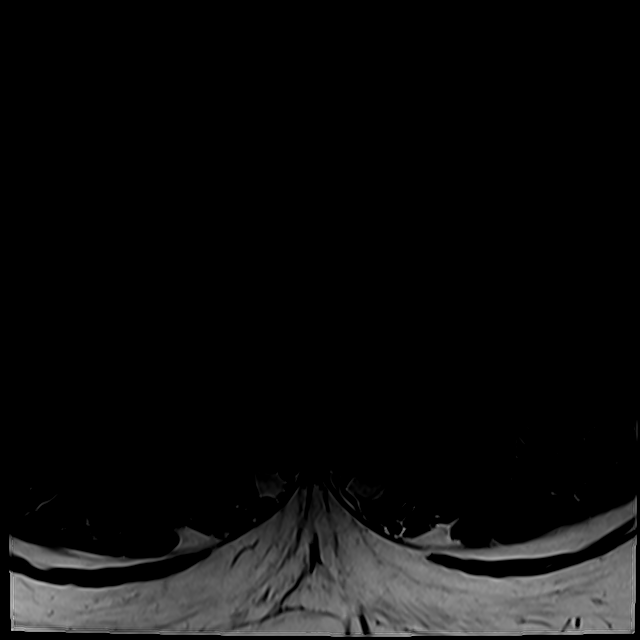

[Series 14: T2 · axial · 4.0mm · 0.31mm/px · z∈[-42,+127]mm · 6 of 36 slices shown (2 of 2)]
[im 1/36]
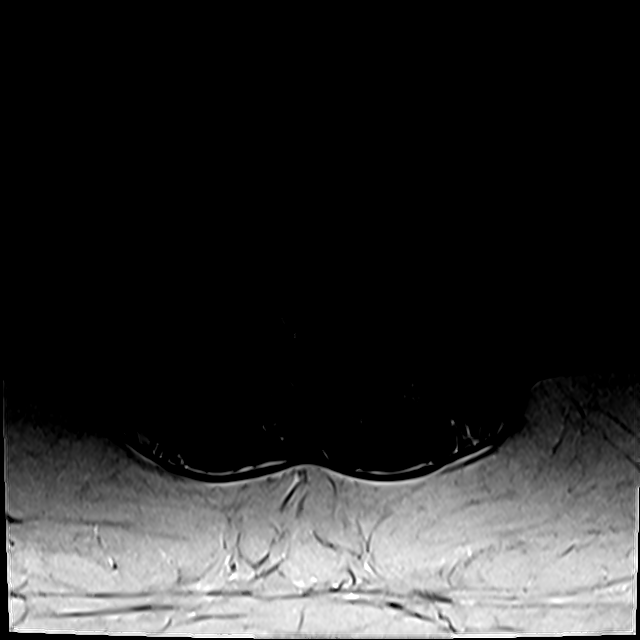
[im 6/36]
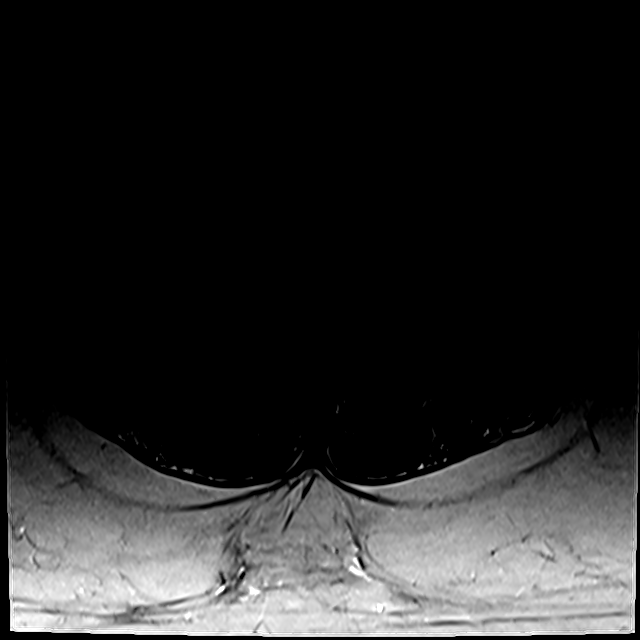
[im 11/36]
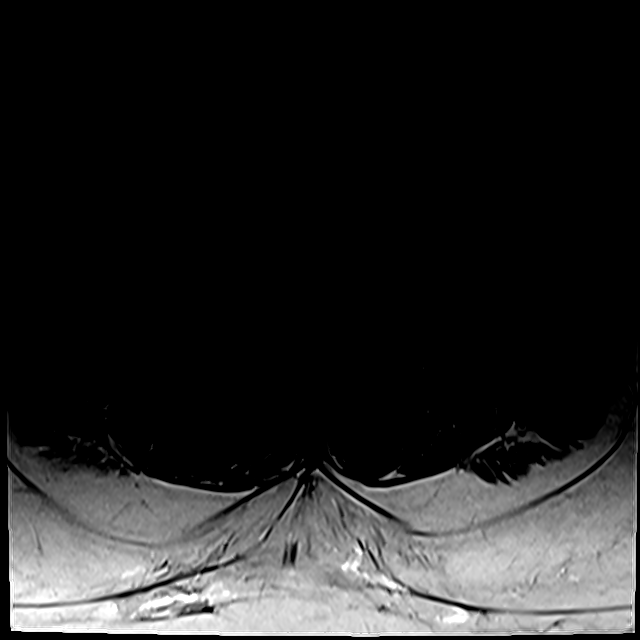
[im 16/36]
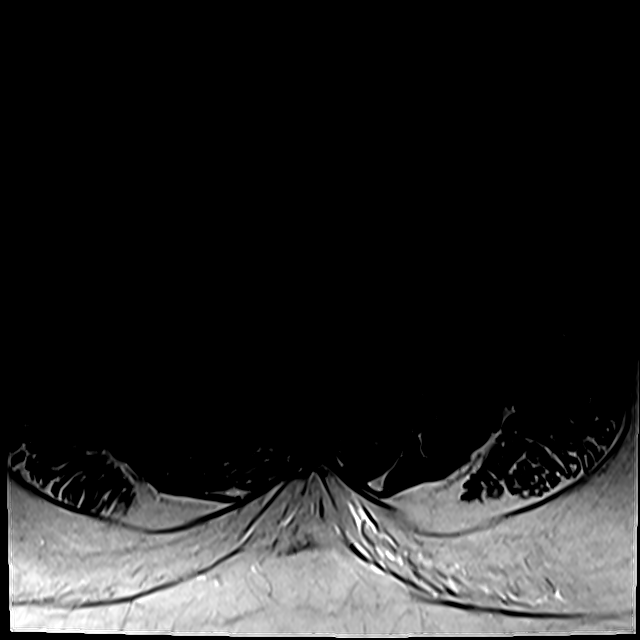
[im 18/36]
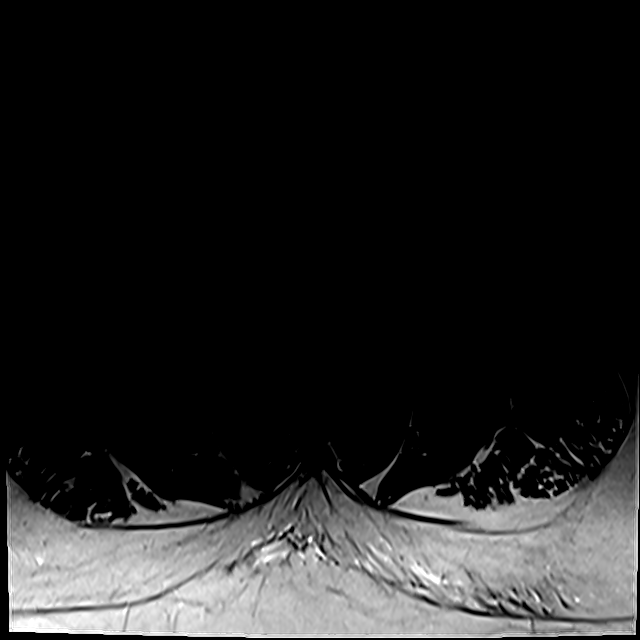
[im 31/36]
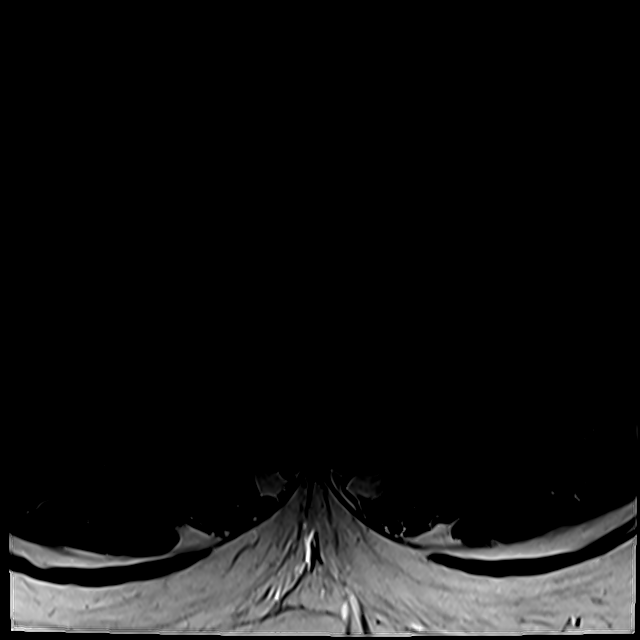

[18 of 48 positions shown; findings below may reference images not displayed]

FINDINGS: Segmentation: Normal segmentation. Lowest well-formed disc is
labeled the L5-S1 level.

Alignment: 4 mm anterolisthesis of L4 on L5. Vertebral bodies
otherwise normally aligned with preservation of the normal lumbar
lordosis.

Vertebrae: Vertebral body heights maintained. No evidence for acute
or chronic fracture. Signal intensity within the vertebral body bone
marrow within normal limits. No marrow edema.

Conus medullaris: Extends to the L2 level and appears normal.

Paraspinal and other soft tissues: Paraspinous soft tissues
demonstrate no acute abnormality. T2 hyperintense simple cyst
partially visualized at the left kidney. Visualized visceral
structures otherwise unremarkable.

Disc levels:

Diffuse congenital shortening of the pedicles noted.

L1-2:  Negative.

L2-3:  Negative.

L3-4: No significant disc bulge or disc protrusion. Bilateral facet
and ligamentum flavum hypertrophy. Short pedicles pedicles with
epidural lipomatosis. Resultant moderate canal stenosis with the
thecal sac measuring 10 mm in AP diameter. Mild bilateral foraminal
narrowing due to short pedicles.

L4-5: 4 mm anterolisthesis of L4 on L5. Associated diffuse disc
bulge with disc desiccation. Superimposed central disc protrusion
indents the ventral thecal sac. Moderate facet arthrosis
bilaterally. Changes superimposed on short pedicles results in
severe canal and subarticular stenosis bilaterally. Thecal sac
measures 6 mm in AP diameter. Moderate bilateral foraminal
narrowing, right worse than left.

L5-S1: Diffuse degenerative disc bulge with disc desiccation and
intervertebral disc space narrowing. Broad posterior disc
protrusion, slightly eccentric to the right, flattens the ventral
thecal sac. Mild bilateral facet arthrosis. Short pedicles.
Resultant severe canal and subarticular stenosis bilaterally.
Protruding disc contacts and potentially affects the transiting S1
nerve roots bilaterally. Thecal sac measures approximately 6 mm in
AP diameter. Moderate left with severe right foraminal stenosis due
to disc bulge, facet disease, and short pedicles.
IMPRESSION: 1. 4 mm anterolisthesis of L4 on L5 with associated central disc
protrusion and moderate bilateral facet arthrosis. Resultant severe
canal and subarticular stenosis bilaterally, with right worse than
left moderate foraminal stenosis.
2. Broad central disc protrusion at L5-S1 with resultant severe
canal and subarticular stenosis. Severe right with moderate left
foraminal narrowing at this level.
3. Moderate canal stenosis at L3-4 due to short pedicles, epidural
lipomatosis, and facet hypertrophy.
4. Diffuse congenital shortening of the pedicles.

## 2017-07-12 ENCOUNTER — Ambulatory Visit (HOSPITAL_COMMUNITY)
Admission: RE | Admit: 2017-07-12 | Discharge: 2017-07-12 | Disposition: A | Discharge status: Home / Self Care | Payer: 59 | Source: Ambulatory Visit | Attending: Internal Medicine | Admitting: Internal Medicine

## 2017-07-12 ENCOUNTER — Other Ambulatory Visit (HOSPITAL_COMMUNITY): Payer: Self-pay | Admitting: Internal Medicine

## 2017-07-12 DIAGNOSIS — M7989 Other specified soft tissue disorders: Principal | ICD-10-CM

## 2017-07-12 DIAGNOSIS — I10 Essential (primary) hypertension: Secondary | ICD-10-CM | POA: Diagnosis present

## 2017-07-12 DIAGNOSIS — M79605 Pain in left leg: Secondary | ICD-10-CM

## 2017-07-12 NOTE — Progress Notes (Signed)
*  Preliminary Results* Left lower extremity venous duplex completed. Left lower extremity is negative for deep vein thrombosis. There is no evidence of left Baker's cyst.  07/12/2017 5:35 PM  Gertie FeyMichelle Shereen Marton, BS, RVT, RDCS, RDMS

## 2017-07-26 DIAGNOSIS — R739 Hyperglycemia, unspecified: Secondary | ICD-10-CM | POA: Diagnosis not present

## 2017-08-15 DIAGNOSIS — I10 Essential (primary) hypertension: Secondary | ICD-10-CM | POA: Diagnosis not present

## 2017-08-16 DIAGNOSIS — Z719 Counseling, unspecified: Secondary | ICD-10-CM | POA: Diagnosis not present

## 2017-08-30 DIAGNOSIS — Z719 Counseling, unspecified: Secondary | ICD-10-CM | POA: Diagnosis not present

## 2017-09-14 DIAGNOSIS — Z719 Counseling, unspecified: Secondary | ICD-10-CM | POA: Diagnosis not present

## 2017-10-11 DIAGNOSIS — Z719 Counseling, unspecified: Secondary | ICD-10-CM | POA: Diagnosis not present

## 2017-11-24 DIAGNOSIS — Z719 Counseling, unspecified: Secondary | ICD-10-CM | POA: Diagnosis not present

## 2017-12-08 DIAGNOSIS — Z719 Counseling, unspecified: Secondary | ICD-10-CM | POA: Diagnosis not present

## 2017-12-15 DIAGNOSIS — Z719 Counseling, unspecified: Secondary | ICD-10-CM | POA: Diagnosis not present

## 2017-12-29 DIAGNOSIS — Z719 Counseling, unspecified: Secondary | ICD-10-CM | POA: Diagnosis not present

## 2018-01-19 DIAGNOSIS — Z719 Counseling, unspecified: Secondary | ICD-10-CM | POA: Diagnosis not present

## 2018-01-25 DIAGNOSIS — Z719 Counseling, unspecified: Secondary | ICD-10-CM | POA: Diagnosis not present

## 2018-02-01 DIAGNOSIS — Z719 Counseling, unspecified: Secondary | ICD-10-CM | POA: Diagnosis not present

## 2019-09-25 ENCOUNTER — Other Ambulatory Visit (HOSPITAL_COMMUNITY): Payer: Self-pay | Admitting: General Surgery

## 2019-09-25 ENCOUNTER — Other Ambulatory Visit: Payer: Self-pay | Admitting: General Surgery

## 2019-10-04 ENCOUNTER — Ambulatory Visit (HOSPITAL_COMMUNITY): Payer: 59

## 2019-10-04 ENCOUNTER — Ambulatory Visit (HOSPITAL_COMMUNITY): Admission: RE | Admit: 2019-10-04 | Payer: 59 | Source: Ambulatory Visit

## 2019-10-17 ENCOUNTER — Ambulatory Visit (HOSPITAL_COMMUNITY): Payer: 59

## 2019-10-20 NOTE — Progress Notes (Signed)
Cardiology Office Note:   Date:  10/21/2019  NAME:  Brent Cole    MRN: [id number] DOB:  14-Aug-1975   PCP:  Brent Dills, MD  Cardiologist:  No primary care provider on file.   Referring MD: Brent Adu, MD   Chief Complaint  Patient presents with  . Pre-op Exam   History of Present Illness:   Brent Cole is a 44 y.o. male with a hx of obesity (555 lbs BMI 75), HTN, arthritis who is being seen today for the evaluation of preop assessment at the request of Brent Adu, MD.  He presents for preoperative assessment.  He will have bariatric surgery soon.  He is extremely overweight but reports he can climb a flight of stairs without any chest pain or shortness of breath.  He reports prolonged standing does give him back pain.  Has never had a heart attack or stroke.  His EKG demonstrates normal sinus rhythm.  His cardiovascular exam is without murmurs rubs or gallops.  He is a never smoker.  He drinks alcohol within moderation.  No illicit drugs reported.  He does report a family history of heart disease in his grandparents.  He has had multiple surgeries in the past.  He had back surgery 2 years ago without any issues.  No issues with anesthesia.  He reports he does not get any chest pain but does get short of breath when he does prolonged activity.  We did go over the fact that he is high risk for surgery.  This is mainly driven by his weight of 555 pounds and a BMI of 75.  He does understand this risk.  I did review his most recent lipid profile which shows a total cholesterol 111, HDL 35, LDL 60, triglycerides 84.  He was taken off a statin agent due to statin intolerance.  He also has diabetes with an A1c of 6.5.  He is not on any medication for this.  It appears that bariatric surgery will take care of this.  I do agree he is a good candidate for this and this is much needed.  We did discuss that he likely has sleep apnea but he is not treated for this.  He does need to get retested.  He  reports he will discuss this with his primary care physician.  Past Medical History: Past Medical History:  Diagnosis Date  . ACL tear    left knee - no surgery - physical therapy  . Arthritis    left knee  . History of pneumonia as a child   . Hyperlipidemia   . Hypertension   . Sleep apnea    Sleep apnnea currently not treated, pt. reports that he had CPAP at one time but he knows he needs to be tested  again    Past Surgical History: Past Surgical History:  Procedure Laterality Date  . Bilateral laminectomy decompression   01/2016  . NO PAST SURGERIES      Current Medications: Current Meds  Medication Sig  . amLODipine (NORVASC) 10 MG tablet Take 10 mg by mouth daily.  Marland Kitchen losartan-hydrochlorothiazide (HYZAAR) 100-25 MG tablet Take 1 tablet by mouth daily.     Allergies:    No known allergies   Social History: Social History   Socioeconomic History  . Marital status: Single    Spouse name: Not on file  . Number of children: Not on file  . Years of education: Not on file  . Highest education level: Not  on file  Occupational History  . Occupation: Comptroller  Tobacco Use  . Smoking status: Never Smoker  . Smokeless tobacco: Never Used  Substance and Sexual Activity  . Alcohol use: Yes    Alcohol/week: 0.0 standard drinks    Comment: socially  . Drug use: No  . Sexual activity: Not on file  Other Topics Concern  . Not on file  Social History Narrative  . Not on file   Social Determinants of Health   Financial Resource Strain:   . Difficulty of Paying Living Expenses: Not on file  Food Insecurity:   . Worried About Programme researcher, broadcasting/film/video in the Last Year: Not on file  . Ran Out of Food in the Last Year: Not on file  Transportation Needs:   . Lack of Transportation (Medical): Not on file  . Lack of Transportation (Non-Medical): Not on file  Physical Activity:   . Days of Exercise per Week: Not on file  . Minutes of Exercise per Session: Not on file    Stress:   . Feeling of Stress : Not on file  Social Connections:   . Frequency of Communication with Friends and Family: Not on file  . Frequency of Social Gatherings with Friends and Family: Not on file  . Attends Religious Services: Not on file  . Active Member of Clubs or Organizations: Not on file  . Attends Banker Meetings: Not on file  . Marital Status: Not on file     Family History: The patient's family history includes Heart disease in his maternal grandmother; Hypertension in his mother.  ROS:   All other ROS reviewed and negative. Pertinent positives noted in the HPI.     EKGs/Labs/Other Studies Reviewed:   The following studies were personally reviewed by me today:  EKG:  EKG is ordered today.  The ekg ordered today demonstrates normal sinus rhythm, heart rate 95, no acute ST-T changes, no evidence for infarction, and was personally reviewed by me.   Recent Labs: No results found for requested labs within last 8760 hours.   Recent Lipid Panel    Component Value Date/Time   CHOL 182 01/30/2015 1200   TRIG 41 01/30/2015 1200   HDL 39 (L) 01/30/2015 1200   CHOLHDL 4.7 01/30/2015 1200   LDLCALC 135 (H) 01/30/2015 1200    Physical Exam:   VS:  BP 138/80   Pulse 95   Ht 6' (1.829 m)   Wt (!) 555 lb (251.7 kg)   BMI 75.27 kg/m    Wt Readings from Last 3 Encounters:  10/21/19 (!) 555 lb (251.7 kg)  01/22/16 (!) 465 lb (210.9 kg)  01/14/16 (!) 485 lb 0.2 oz (220 kg)    General: Well nourished, well developed, in no acute distress Heart: Atraumatic, normal size  Eyes: PEERLA, EOMI  Neck: Supple, no JVD Endocrine: No thryomegaly Cardiac: Normal S1, S2; RRR; no murmurs, rubs, or gallops Lungs: Clear to auscultation bilaterally, no wheezing, rhonchi or rales  Abd: Soft, nontender, no hepatomegaly  Ext: Lower extremity edema noted in the left lower extremity, changes consistent with lymphedema are present Musculoskeletal: No deformities, BUE and  BLE strength normal and equal Skin: Warm and dry, no rashes   Neuro: Alert and oriented to person, place, time, and situation, CNII-XII grossly intact, no focal deficits  Psych: Normal mood and affect   ASSESSMENT:   Brent Cole is a 44 y.o. male who presents for the following: 1. Preoperative cardiovascular examination  2. Obesity, morbid, BMI 50 or higher (HCC)     PLAN:   1. Preoperative cardiovascular examination 2. Obesity, morbid, BMI 50 or higher (HCC) -The Revised Cardiac Risk Index = 0 which equates to 0.4% (very low risk) estimated risk of perioperative myocardial infarction, pulmonary edema, ventricular fibrillation, cardiac arrest, or complete heart block.  -His EKG is normal.  His cardiovascular exam is without murmurs rubs or gallops.  He does have lymphedema in the left lower extremity which is obesity related.  He has no evidence of heart failure on exam. -He is high risk for bariatric surgery given his super morbid obesity.  His BMI is 75.  His weight is 555 pounds.  His weight puts him at considerable pulmonary risk. -He is able to climb 1 flight of stairs without any chest pain or shortness of breath.  I think he will do fine from a cardiovascular standpoint however I do worry about him from pulmonary standpoint regarding his weight.  He also needs a sleep apnea evaluated. -Regarding his future surgery I would recommend no further cardiac testing.  His weight does preclude accurate assessment on most stress testing.  I viewed his surgery is urgent given his weight of 555 pounds I would not recommend further testing. -He should continue his current medications.  I think weight loss is indicated as this will improve his diabetes as well as his high blood pressure.  Our team is available in the perioperative period if needed.  Disposition: Return if symptoms worsen or fail to improve.  Medication Adjustments/Labs and Tests Ordered: Current medicines are reviewed at length  with the patient today.  Concerns regarding medicines are outlined above.  Orders Placed This Encounter  Procedures  . EKG 12-Lead   No orders of the defined types were placed in this encounter.   Patient Instructions  Medication Instructions:  The current medical regimen is effective;  continue present plan and medications.  *If you need a refill on your cardiac medications before your next appointment, please call your pharmacy*   Follow-Up: At [facility], you and your health needs are our priority.  As part of our continuing mission to provide you with exceptional heart care, we have created designated Provider Care Teams.  These Care Teams include your primary Cardiologist (physician) and Advanced Practice Providers (APPs -  Physician Assistants and Nurse Practitioners) who all work together to provide you with the care you need, when you need it.  We recommend signing up for the patient portal called "MyChart".  Sign up information is provided on this After Visit Summary.  MyChart is used to connect with patients for Virtual Visits (Telemedicine).  Patients are able to view lab/test results, encounter notes, upcoming appointments, etc.  Non-urgent messages can be sent to your provider as well.   To learn more about what you can do with MyChart, go to [email or web address].    Your next appointment:   As needed  The format for your next appointment:   In Person  Provider:   Lennie Odor, MD      Signed, Lenna Gilford. Flora Lipps, MD [facility]  [address], [address] [place], [place] [address] [phone]  10/21/2019 3:16 PM

## 2019-10-21 ENCOUNTER — Other Ambulatory Visit: Payer: Self-pay

## 2019-10-21 ENCOUNTER — Ambulatory Visit: Payer: 59 | Admitting: Cardiovascular Disease

## 2019-10-21 ENCOUNTER — Encounter: Payer: Self-pay | Admitting: Cardiovascular Disease

## 2019-10-21 VITALS — BP 138/80 | HR 95 | Ht 72.0 in | Wt >= 6400 oz

## 2019-10-21 DIAGNOSIS — Z01810 Encounter for preprocedural cardiovascular examination: Secondary | ICD-10-CM | POA: Diagnosis not present

## 2019-10-21 NOTE — Patient Instructions (Signed)
Medication Instructions:  The current medical regimen is effective;  continue present plan and medications.  *If you need a refill on your cardiac medications before your next appointment, please call your pharmacy*    Follow-Up: At [facility], you and your health needs are our priority.  As part of our continuing mission to provide you with exceptional heart care, we have created designated Provider Care Teams.  These Care Teams include your primary Cardiologist (physician) and Advanced Practice Providers (APPs -  Physician Assistants and Nurse Practitioners) who all work together to provide you with the care you need, when you need it.  We recommend signing up for the patient portal called "MyChart".  Sign up information is provided on this After Visit Summary.  MyChart is used to connect with patients for Virtual Visits (Telemedicine).  Patients are able to view lab/test results, encounter notes, upcoming appointments, etc.  Non-urgent messages can be sent to your provider as well.   To learn more about what you can do with MyChart, go to [email or web address].    Your next appointment:   As needed  The format for your next appointment:   In Person  Provider:   Blasdell O'Neal, MD      

## 2019-10-22 ENCOUNTER — Other Ambulatory Visit (HOSPITAL_BASED_OUTPATIENT_CLINIC_OR_DEPARTMENT_OTHER): Payer: Self-pay

## 2019-10-23 ENCOUNTER — Other Ambulatory Visit (HOSPITAL_COMMUNITY): Payer: 59

## 2019-10-30 ENCOUNTER — Encounter (HOSPITAL_COMMUNITY): Payer: Self-pay

## 2019-10-30 ENCOUNTER — Ambulatory Visit (HOSPITAL_COMMUNITY): Admission: RE | Admit: 2019-10-30 | Payer: 59 | Source: Ambulatory Visit

## 2019-10-31 ENCOUNTER — Encounter: Payer: Self-pay | Admitting: Dietician

## 2019-10-31 ENCOUNTER — Other Ambulatory Visit: Payer: Self-pay

## 2019-10-31 ENCOUNTER — Encounter: Payer: 59 | Attending: General Surgery | Admitting: Dietician

## 2019-10-31 DIAGNOSIS — E669 Obesity, unspecified: Secondary | ICD-10-CM | POA: Insufficient documentation

## 2019-10-31 NOTE — Progress Notes (Signed)
Nutrition Assessment for Bariatric Surgery Medical Nutrition Therapy   Patient was seen on 10/31/2019 for Pre-Operative Nutrition Assessment. Letter of approval faxed to [facility] bariatric surgery program coordinator on 10/31/2019.   Referral stated Supervised Weight Loss (SWL) visits needed: 6  Planned surgery: Sleeve Pt expectation of surgery: to get back into musical/art performance more; be more active; relieve back pain; travel more   NUTRITION ASSESSMENT   Anthropometrics  Start weight at [facility]: 550 lbs (date: 10/31/2019) Height: 71 in BMI: 76.7 kg/m2     Lifestyle & Dietary Hx Patient works as a Comptroller, enjoys Therapist, art. Patient states he is a night owl, gets average of 4-5 hours of sleep per night plus maybe a nap during the day. Typical meal pattern is 2-3 meals per day, might miss breakfast and/or lunch. Common foods eaten include fast food, but sometimes will cook dinner at home.   Any previous deficiencies: no  Micronutrient Nutrition Focused Physical Exam: Hair: no issues observed Eyes: no issues observed Mouth: no issues observed Neck: no issues observed Nails: no issues observed Skin: no issues observed  24-Hr Dietary Recall First Meal: [place] (or McDonald's, or [employer]) Snack: - Second Meal: - Snack: - Third Meal: burger + fries (or pasta) Snack: rarely, sometimes ice cream  Beverages: water, sweet tea, alcohol   NUTRITION DIAGNOSIS  Overweight/obesity ([place]-3.3) related to past poor dietary habits and physical inactivity as evidenced by patient w/ planned Sleeve Gastrectomy surgery following dietary guidelines for continued weight loss.    NUTRITION INTERVENTION  Nutrition counseling (C-1) and education (E-2) to facilitate bariatric surgery goals.  Pre-Op Goals Reviewed with the Patient . Track food and beverage intake (pen and paper, MyFitness Pal, Baritastic app, etc.) . Make healthy food choices while monitoring  portion sizes . Consume 3 meals per day or try to eat every 3-5 hours . Avoid concentrated sugars and fried foods . Keep sugar & fat in the single digits per serving on food labels . Practice CHEWING your food (aim for applesauce consistency) . Practice not drinking 15 minutes before, during, and 30 minutes after each meal and snack . Avoid all carbonated beverages (ex: soda, sparkling beverages)  . Limit caffeinated beverages (ex: coffee, tea, energy drinks) . Avoid all sugar-sweetened beverages (ex: regular soda, sports drinks)  . Avoid alcohol  . Aim for 64-100 ounces of FLUID daily (with at least half of fluid intake being plain water)  . Aim for at least 60-80 grams of PROTEIN daily . Look for a liquid protein source that contains ?15 g protein and ?5 g carbohydrate (ex: shakes, drinks, shots) . Make a list of non-food related activities . Physical activity is an important part of a healthy lifestyle so keep it moving! The goal is to reach 150 minutes of exercise per week, including cardiovascular and weight baring activity.  *Goals that are bolded indicate the pt would like to start working towards these  Handouts Provided Include  . Bariatric Surgery Nutrition Visits  . Pre-Op Goals  Learning Style & Readiness for Change Teaching method utilized: Visual & Auditory  Demonstrated degree of understanding via: Teach Back  Barriers to learning/adherence to lifestyle change: None Identified    MONITORING & EVALUATION Dietary intake, weekly physical activity, body weight, and pre-op goals reached at next nutrition visit.   Next Steps Patient is to return to [facility] in 1 month for 1st SWL Visit.

## 2019-10-31 NOTE — Patient Instructions (Signed)
It was nice meeting you! Remember to start working through the Pre-Op Goals discussed today, starting with: . Track food and beverage intake (pen and paper, MyFitness Pal, Baritastic app, etc.) . Consume 3 meals per day or try to eat every 3-5 hours . Avoid concentrated sugars and fried foods

## 2019-11-28 ENCOUNTER — Encounter: Payer: 59 | Attending: General Surgery | Admitting: Skilled Nursing Facility1

## 2019-11-28 DIAGNOSIS — E669 Obesity, unspecified: Secondary | ICD-10-CM | POA: Diagnosis not present

## 2019-11-28 NOTE — Progress Notes (Signed)
Supervised Weight Loss Visit Bariatric Nutrition Educatio  Planned Surgery: sleeve Pt Expectation of Surgery/ Goals:   1 out of 6 SWL Appointments   MyChart Visit: Idenifed pt by name and DOB  Star given previously: no  NUTRITION ASSESSMENT  Anthropometrics  Start weight at [facility]: 550 lbs (date: 10/31/2019) Today's weight: 547 lbs (Pt reported due to MyChart visit) Weight change: 3 lbs  BMI: 74.19 kg/m2    Clinical  Medical Hx:  Medications:  Labs:  Notable Signs/Symptoms:   Lifestyle & Dietary Hx  Pt states he has really been trying to make changes such as eating breakfast.  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 waking around 9am-10am starts work 12  Estimated daily fluid intake:  oz Supplements:  Current average weekly physical activity:  24-Hr Dietary Recall First Meal 12-1: banana or apple Snack:  Second Meal: fast food or baked + mac n cheese + cabbage Snack:  Third Meal: fast food or something made Snack:  Beverages: water, sweet tea, soda, liquour on the weekends  Estimated Energy Needs Calories: 1800   NUTRITION DIAGNOSIS  Overweight/obesity ([place]-3.3) related to past poor dietary habits and physical inactivity as evidenced by patient w/ planned  surgery following dietary guidelines for continued weight loss.   NUTRITION INTERVENTION  Nutrition counseling (C-1) and education (E-2) to facilitate bariatric surgery goals.  Pre-Op Goals Progress & New Goals  Continue Eat 3 meals a day; include more structure in your meal timing  Continue Avoid concentrated and fried foods  Log what you eat and drink: unable to do  Continue: increasing water aiming for 4 bottles of water  NEW: Aim to bring lunch to work; some meal prep is involved; use your meal prep kit  Handouts Provided Include     Learning Style & Readiness for Change Teaching method utilized: Visual & Auditory  Demonstrated degree of understanding via: Teach Back   Barriers to learning/adherence to lifestyle change: non identified   RD's Notes for next Visit   Assess pts adherence to chosen goals   MONITORING & EVALUATION Dietary intake, weekly physical activity, body weight, and pre-op goals in 1 month.   Next Steps  Patient is to return to [facility] in 1 month

## 2019-11-29 ENCOUNTER — Other Ambulatory Visit: Payer: Self-pay

## 2019-11-29 ENCOUNTER — Ambulatory Visit (HOSPITAL_BASED_OUTPATIENT_CLINIC_OR_DEPARTMENT_OTHER): Payer: 59 | Attending: General Surgery | Admitting: Internal Medicine

## 2019-11-29 ENCOUNTER — Encounter (HOSPITAL_BASED_OUTPATIENT_CLINIC_OR_DEPARTMENT_OTHER): Payer: 59 | Admitting: Internal Medicine

## 2019-11-29 VITALS — Ht 72.0 in | Wt >= 6400 oz

## 2019-11-29 DIAGNOSIS — G4733 Obstructive sleep apnea (adult) (pediatric): Secondary | ICD-10-CM | POA: Insufficient documentation

## 2019-11-29 DIAGNOSIS — R0683 Snoring: Secondary | ICD-10-CM | POA: Insufficient documentation

## 2019-11-29 DIAGNOSIS — R0902 Hypoxemia: Secondary | ICD-10-CM | POA: Diagnosis not present

## 2019-12-14 NOTE — Procedures (Signed)
Patient Name: Brent Cole, Sperl Date: 11/29/2019 Gender: Male D.O.B: Jul 04, 1975 Age (years): 43 Referring Provider: [name] Height (inches): 72 Interpreting Physician: Jetty Duhamel MD, ABSM Weight (lbs): 547 RPSGT: Rolene Arbour BMI: 74 MRN: [id number] Neck Size: 20.00 <br> <br> CLINICAL INFORMATION Sleep Study Type: NPSG Indication for sleep study: OSA Epworth Sleepiness Score: 14  SLEEP STUDY TECHNIQUE As per the AASM Manual for the Scoring of Sleep and Associated Events v2.3 (April 2016) with a hypopnea requiring 4% desaturations.  The channels recorded and monitored were frontal, central and occipital EEG, electrooculogram (EOG), submentalis EMG (chin), nasal and oral airflow, thoracic and abdominal wall motion, anterior tibialis EMG, snore microphone, electrocardiogram, and pulse oximetry.  MEDICATIONS Medications self-administered by patient taken the night of the study : none reported  SLEEP ARCHITECTURE The study was initiated at 11:32:22 PM and ended at 5:36:01 AM.  Sleep onset time was 52.4 minutes and the sleep efficiency was 33.5%%. The total sleep time was 121.7 minutes.  Stage REM latency was 244.0 minutes.  The patient spent 12.3%% of the night in stage N1 sleep, 82.7%% in stage N2 sleep, 0.0%% in stage N3 and 4.9% in REM.  Alpha intrusion was absent.  Supine sleep was 78.45%.  RESPIRATORY PARAMETERS The overall apnea/hypopnea index (AHI) was 88.7 per hour. There were 92 total apneas, including 92 obstructive, 0 central and 0 mixed apneas. There were 88 hypopneas and 5 RERAs.  The AHI during Stage REM sleep was 10.0 per hour.  AHI while supine was 85.4 per hour.  The mean oxygen saturation was 89.8%. The minimum SpO2 during sleep was 70.0%.  moderate snoring was noted during this study.  CARDIAC DATA The 2 lead EKG demonstrated sinus rhythm. The mean heart rate was 77.9 beats per minute. Other EKG findings include: None.  LEG  MOVEMENT DATA The total PLMS were 0 with a resulting PLMS index of 0.0. Associated arousal with leg movement index was 0.0 .  IMPRESSIONS - Severe obstructive sleep apnea occurred during this study (AHI = 88.7/h). - No significant central sleep apnea occurred during this study (CAI = 0.0/h). - Severe oxygen desaturation was noted during this study (Min O2 = 70.0%). Mean O2 sar 92%. Time with O2 sat 88% or less was 61 minutes. - The patient snored with moderate snoring volume. - No cardiac abnormalities were noted during this study. - Clinically significant periodic limb movements did not occur during sleep. No significant associated arousals. - Sleep onset 1:15 AM (patient reports usual sleep around 3:00 AM) Marked dificulty maintaining sleep with frequent brief arousal and awakening.  DIAGNOSIS - Obstructive Sleep Apnea (G47.33) - Nocturnal Hypoxemia (G47.36)  RECOMMENDATIONS - Suggest return for CPAP titration sleep study. This will provide required documentation if supplemental O2 is also needed.  - Avoid alcohol, sedatives and other CNS depressants that may worsen sleep apnea and disrupt normal sleep architecture. - Sleep hygiene should be reviewed to assess factors that may improve sleep quality. - Weight management and regular exercise should be initiated or continued if appropriate.  [Electronically signed] 12/14/2019 11:57 AM  Jetty Duhamel MD, ABSM Diplomate, American Board of Sleep Medicine   NPI: [id number]                          Jetty Duhamel Diplomate, American Board of Sleep Medicine  ELECTRONICALLY SIGNED ON:  12/14/2019, 11:51 AM [facility] PH: [phone]   FX: [phone] ACCREDITED BY THE AMERICAN  ACADEMY OF SLEEP MEDICINE

## 2019-12-27 ENCOUNTER — Encounter: Payer: 59 | Attending: General Surgery | Admitting: Dietician

## 2019-12-27 ENCOUNTER — Encounter: Payer: Self-pay | Admitting: Dietician

## 2019-12-27 ENCOUNTER — Encounter: Payer: Self-pay | Admitting: Pulmonary Disease

## 2019-12-27 ENCOUNTER — Other Ambulatory Visit: Payer: Self-pay

## 2019-12-27 ENCOUNTER — Ambulatory Visit (INDEPENDENT_AMBULATORY_CARE_PROVIDER_SITE_OTHER): Payer: 59 | Admitting: Pulmonary Disease

## 2019-12-27 VITALS — BP 154/84 | HR 96 | Temp 97.5°F | Ht 72.0 in | Wt >= 6400 oz

## 2019-12-27 DIAGNOSIS — E669 Obesity, unspecified: Secondary | ICD-10-CM | POA: Insufficient documentation

## 2019-12-27 DIAGNOSIS — G4733 Obstructive sleep apnea (adult) (pediatric): Secondary | ICD-10-CM | POA: Diagnosis not present

## 2019-12-27 NOTE — Patient Instructions (Signed)
Severe obstructive sleep apnea  DME referral  Auto titrating CPAP with pressure settings of 5-20 will be appropriate  Encourage weight loss measures  I will see you back in about 4 to 6 weeks following initiation of CPAP-we will give an appointment 2 months from now  Call with significant concerns  It does take a few days to get used to having a mask over your face and getting comfortable with using CPAP

## 2019-12-27 NOTE — Progress Notes (Signed)
Brent Cole    [id number]    [birth year]  Primary Care Physician:Polite, Windy Fast, MD  Referring Physician: Renford Dills, MD [address] [place],  [place] [address]  Chief complaint:   Patient was recently diagnosed with obstructive sleep apnea Had an in lab study showing severe obstructive sleep apnea  HPI:  Currently being evaluated for bariatric surgery  History of snoring, witnessed apneas Usually goes to bed between about 2 AM Takes about 30 minutes to 1 hour to fall asleep  Usually 2-3 awakenings Final wake up time about 10 AM   Harris previous study done about 25 years ago  Most recent study shows severe obstructive sleep apnea with AHI of 88  He has hypertension  No family history of obstructive sleep apnea Has occasional dryness of his mouth in the mornings No headaches No excessive sweating  Feels a sleeps well at night  Does not smoke  He had a sleep study done about 25 years ago that showed what he felt was mild obstructive sleep apnea, was started on CPAP therapy was only able to tolerate for about a year and stopped using it because he could not tolerate the pressures  Outpatient Encounter Medications as of 12/27/2019  Medication Sig  . amLODipine (NORVASC) 10 MG tablet Take 10 mg by mouth daily.  Marland Kitchen losartan-hydrochlorothiazide (HYZAAR) 100-25 MG tablet Take 1 tablet by mouth daily.   No facility-administered encounter medications on file as of 12/27/2019.    Allergies as of 12/27/2019 - Review Complete 12/27/2019  Allergen Reaction Noted  . No known allergies  01/15/2016    Past Medical History:  Diagnosis Date  . ACL tear    left knee - no surgery - physical therapy  . Arthritis    left knee  . History of pneumonia as a child   . Hyperlipidemia   . Hypertension   . Sleep apnea    Sleep apnnea currently not treated, pt. reports that he had CPAP at one time but he knows he needs to be tested  again    Past  Surgical History:  Procedure Laterality Date  . Bilateral laminectomy decompression   01/2016  . NO PAST SURGERIES      Family History  Problem Relation Age of Onset  . Hypertension Mother   . Heart disease Maternal Grandmother     Social History   Socioeconomic History  . Marital status: Single    Spouse name: Not on file  . Number of children: Not on file  . Years of education: Not on file  . Highest education level: Not on file  Occupational History  . Occupation: Comptroller  Tobacco Use  . Smoking status: Never Smoker  . Smokeless tobacco: Never Used  Substance and Sexual Activity  . Alcohol use: Yes    Alcohol/week: 1.0 standard drink    Types: 1 Standard drinks or equivalent per week    Comment: socially  . Drug use: No  . Sexual activity: Not on file  Other Topics Concern  . Not on file  Social History Narrative  . Not on file   Social Determinants of Health   Financial Resource Strain:   . Difficulty of Paying Living Expenses: Not on file  Food Insecurity:   . Worried About Programme researcher, broadcasting/film/video in the Last Year: Not on file  . Ran Out of Food in the Last Year: Not on file  Transportation Needs:   .  Lack of Transportation (Medical): Not on file  . Lack of Transportation (Non-Medical): Not on file  Physical Activity:   . Days of Exercise per Week: Not on file  . Minutes of Exercise per Session: Not on file  Stress:   . Feeling of Stress : Not on file  Social Connections:   . Frequency of Communication with Friends and Family: Not on file  . Frequency of Social Gatherings with Friends and Family: Not on file  . Attends Religious Services: Not on file  . Active Member of Clubs or Organizations: Not on file  . Attends Banker Meetings: Not on file  . Marital Status: Not on file  Intimate Partner Violence:   . Fear of Current or Ex-Partner: Not on file  . Emotionally Abused: Not on file  . Physically Abused: Not on file  . Sexually Abused:  Not on file    Review of Systems  Respiratory: Positive for apnea.   Psychiatric/Behavioral: Positive for sleep disturbance.    Vitals:   12/27/19 1614  BP: (!) 154/84  Pulse: 96  Temp: (!) 97.5 F (36.4 C)  SpO2: 97%     Physical Exam Constitutional:      Appearance: He is obese.  HENT:     Nose: No congestion.     Mouth/Throat:     Mouth: Mucous membranes are moist.     Comments: Mallampati 4, crowded oropharynx Cardiovascular:     Rate and Rhythm: Normal rate and regular rhythm.     Pulses: Normal pulses.     Heart sounds: Normal heart sounds. No murmur heard.  No friction rub.  Pulmonary:     Effort: No respiratory distress.     Breath sounds: No stridor. No wheezing or rhonchi.  Musculoskeletal:     Cervical back: No rigidity or tenderness.  Neurological:     Mental Status: He is alert.  Psychiatric:        Mood and Affect: Mood normal.    Data Reviewed: Sleep study reviewed showing severe obstructive sleep apnea, moderately severe oxygen desaturations associated with events  Assessment:  Severe obstructive sleep apnea  Morbid obesity  Pathophysiology of sleep disordered breathing discussed Treatment options discussed  Failed treatment for sleep apnea in the past when he had a study done 25 years ago, used CPAP for less than a year, noted pressures were too high  Recent sleep study was reviewed to ascertain severity of his sleep apnea and options of treatment  Plan/Recommendations: Auto titrating CPAP 5-20  Close clinical follow-up  Risks with not treating obstructive sleep apnea discussed  Importance of weight control discussed   Virl Diamond MD [facility] 12/27/2019, 4:33 PM  CC: Renford Dills, MD

## 2019-12-27 NOTE — Patient Instructions (Signed)
   Continue to work on eating 3 meals per day, including more structure in meal timing  Continue to work on making food at home and avoiding fried foods  Continue increasing water intake   Continue to practice meal prepping

## 2019-12-27 NOTE — Progress Notes (Signed)
Supervised Weight Loss Visit Bariatric Nutrition Education  Planned surgery: Sleeve Pt expectation of surgery: to get back into musical/art performance more; be more active; relieve back pain; travel more  2 out of 6 SWL Appointments   MyChart Visit This visit was completed via MyChart.   I spoke with Brent Cole and verified that I was speaking with the correct person with two patient identifiers (full name and date of birth).   I discussed the limitations related to this kind of visit and the patient is willing to proceed.   NUTRITION ASSESSMENT  Anthropometrics  Start weight at [facility]: 550 lbs (date: 10/31/2019) Today's weight: 549 lbs (pt reported via mychart visit) BMI: 74.5 kg/m2    Lifestyle & Dietary Hx Patient states he has not been "doing as well" over the past month due to an emergency situation in which he had to stay in [place] for a while to help a friend. States that he still intends to work on having more consistency with meal times such as getting in 3 meals per day rather than only having one meal at dinner time. Despite the busyness, patient has been trying to meal prep and cook more at home, reducing intake of fast foods but still does eat out due to convenience. Does not make fried foods at home. For example made chicken salad and baked chicken wings, and rarely snacks but may have pumpkin spice cheerios.   Estimated daily fluid intake: 64+ oz Current average weekly physical activity: N/A  24-Hr Dietary Recall First Meal: - Snack: - Second Meal: chicken salad + crackers  Snack: - Third Meal: baked chicken wings + lima beans  Snack: - Beverages: water   NUTRITION DIAGNOSIS  Overweight/obesity ([place]-3.3) related to past poor dietary habits and physical inactivity as evidenced by patient w/ planned Sleeve surgery following dietary guidelines for continued weight loss.   NUTRITION INTERVENTION  Nutrition counseling (C-1) and education (E-2) to facilitate  bariatric surgery goals.  Pre-Op Goals Progress & New Goals . Continue to work on eating 3 meals per day, including more structure in meal timing . Continue to work on making food at home and avoiding fried foods . Continue increasing water intake  . Continue to practice meal prepping  Learning Style & Readiness for Change Teaching method utilized: Visual & Auditory  Demonstrated degree of understanding via: Teach Back  Barriers to learning/adherence to lifestyle change: None Identified    MONITORING & EVALUATION Dietary intake, weekly physical activity, body weight, and pre-op goals in 1 month.   Next Steps  Patient is to return to [facility] (connect via MyChart) in 1 month for 3rd SWL visit.

## 2020-01-01 ENCOUNTER — Inpatient Hospital Stay (HOSPITAL_COMMUNITY): Admission: RE | Admit: 2020-01-01 | Payer: 59 | Source: Ambulatory Visit

## 2020-01-01 ENCOUNTER — Ambulatory Visit (HOSPITAL_COMMUNITY): Payer: 59

## 2020-01-13 ENCOUNTER — Other Ambulatory Visit: Payer: Self-pay

## 2020-01-13 ENCOUNTER — Ambulatory Visit (HOSPITAL_COMMUNITY)
Admission: RE | Admit: 2020-01-13 | Discharge: 2020-01-13 | Disposition: A | Discharge status: Home / Self Care | Payer: 59 | Source: Ambulatory Visit | Attending: General Surgery | Admitting: General Surgery

## 2020-01-28 ENCOUNTER — Encounter: Payer: 59 | Attending: General Surgery | Admitting: Skilled Nursing Facility1

## 2020-01-28 DIAGNOSIS — E669 Obesity, unspecified: Secondary | ICD-10-CM

## 2020-01-28 NOTE — Progress Notes (Signed)
Supervised Weight Loss Visit Bariatric Nutrition Education  Planned surgery: Sleeve Pt expectation of surgery: to get back into musical/art performance more; be more active; relieve back pain; travel more  3 out of 6 SWL Appointments   MyChart Visit This visit was completed via MyChart.   I spoke with Darol Destine and verified that I was speaking with the correct person with two patient identifiers (full name and date of birth).   I discussed the limitations related to this kind of visit and the patient is willing to proceed.   NUTRITION ASSESSMENT  Anthropometrics  Start weight at [facility]: 550 lbs (date: 10/31/2019) Today's weight: 551 lbs (pt reported via mychart visit) BMI: 74.73 kg/m2    Lifestyle & Dietary Hx Patient states he has not been "doing as well" over the past month.  Pt states he has been doeing less fast food and is trying to change up the size he gets and what he might get using his charges to control that.  Pt states he has been trying to eat more regularly throughout the day so making more of an effort to eat lunch and breakfast. Pt states he is not super sure of his barriers but maybe planning might be one. Pt states he has his groceries delivered.   Estimated daily fluid intake: 64+ oz Current average weekly physical activity: N/A  24-Hr Dietary Recall First Meal: skipped Snack:  Second Meal: fast food Snack:  Third Meal: fast food Snack 3am: half sandwich Beverages: water, sweet tea   NUTRITION DIAGNOSIS  Overweight/obesity ([place]-3.3) related to past poor dietary habits and physical inactivity as evidenced by patient w/ planned Sleeve surgery following dietary guidelines for continued weight loss.   NUTRITION INTERVENTION  Nutrition counseling (C-1) and education (E-2) to facilitate bariatric surgery goals.  Pre-Op Goals Progress & New Goals  Continue to work on eating 3 meals per day, including more structure in meal timing  Continue to work on  making food at home and avoiding fried foods  Continue increasing water intake   Continue to practice meal prepping  NEW: batch cook and eat the leftovers; aim to do this 2 times a week  NEW: identify the barriers you have to making the changes  Learning Style & Readiness for Change Teaching method utilized: Visual & Auditory  Demonstrated degree of understanding via: Teach Back  Barriers to learning/adherence to lifestyle change: None Identified    MONITORING & EVALUATION Dietary intake, weekly physical activity, body weight, and pre-op goals in 1 month.   Next Steps  Patient is to return to [facility] in 1

## 2020-02-27 ENCOUNTER — Ambulatory Visit: Payer: 59 | Admitting: Skilled Nursing Facility1

## 2020-03-09 ENCOUNTER — Encounter: Payer: 59 | Attending: General Surgery | Admitting: Skilled Nursing Facility1

## 2020-03-09 ENCOUNTER — Other Ambulatory Visit: Payer: Self-pay

## 2020-03-09 DIAGNOSIS — E669 Obesity, unspecified: Secondary | ICD-10-CM | POA: Diagnosis present

## 2020-03-09 NOTE — Progress Notes (Signed)
Supervised Weight Loss Visit Bariatric Nutrition Education  Planned surgery: Sleeve Pt expectation of surgery: to get back into musical/art performance more; be more active; relieve back pain; travel more  4 out of 6 SWL Appointments   NUTRITION ASSESSMENT  Anthropometrics  Start weight at [facility]: 550 lbs (date: 10/31/2019) Today's weight: 556 lbs  BMI: 75.52 kg/m2    Lifestyle & Dietary Hx   Pt states he had every intention of doing better with making changes, stating he had the intention but did not put it into action. Pt states he struggles with his types of foods but also the structure of foods throughout the day. Pt states he is a night owl. Pt states throughout the day he does not think about himself not until the late night does he consider himself. Pt states he will be going back to grad school starting in fall which he has been wanting to do for himself in [employer]. Pt states he is trying to work towards doing things he wants to do and that will make him happy.    Estimated daily fluid intake: 64+ oz Current average weekly physical activity: N/A  24-Hr Dietary Recall First Meal: skipped Snack:  Second Meal: fast food Snack:  Third Meal: fast food Snack 3am: half sandwich Beverages: water, sweet tea   NUTRITION DIAGNOSIS  Overweight/obesity ([place]-3.3) related to past poor dietary habits and physical inactivity as evidenced by patient w/ planned Sleeve surgery following dietary guidelines for continued weight loss.   NUTRITION INTERVENTION  Nutrition counseling (C-1) and education (E-2) to facilitate bariatric surgery goals.  Pre-Op Goals Progress & New Goals . Continue to work on eating 3 meals per day, including more structure in meal timing . Continue to work on making food at home and avoiding fried foods . Continue increasing water intake  . Continue to practice meal prepping . Continue: batch cook and eat the leftovers; aim to do this 2 times a  week . Continue: identify the barriers you have to making the changes . NEW: go to bed at certain time: 1 am: winding down at 1am and turn everything off by 2; ty this new behavior to old behaviors you have already have such as talking with your friend and her going to bed  Learning Style & Readiness for Change Teaching method utilized: Visual & Auditory  Demonstrated degree of understanding via: Teach Back  Barriers to learning/adherence to lifestyle change: None Identified    MONITORING & EVALUATION Dietary intake, weekly physical activity, body weight, and pre-op goals in 1 month.   Next Steps  Patient is to return to [facility] in 1 month

## 2020-04-06 ENCOUNTER — Encounter: Payer: 59 | Attending: General Surgery | Admitting: Skilled Nursing Facility1

## 2020-04-06 ENCOUNTER — Ambulatory Visit (INDEPENDENT_AMBULATORY_CARE_PROVIDER_SITE_OTHER): Payer: 59 | Admitting: Psychology

## 2020-04-06 DIAGNOSIS — E669 Obesity, unspecified: Secondary | ICD-10-CM | POA: Insufficient documentation

## 2020-04-06 DIAGNOSIS — F509 Eating disorder, unspecified: Secondary | ICD-10-CM

## 2020-04-06 NOTE — Progress Notes (Signed)
Supervised Weight Loss Visit Bariatric Nutrition Education  Planned surgery: Sleeve Pt expectation of surgery: to get back into musical/art performance more; be more active; relieve back pain; travel more  5 out of 6 SWL Appointments   NUTRITION ASSESSMENT  Anthropometrics  Start weight at [facility]: 550 lbs (date: 10/31/2019) Today's weight: 552 lbs (weighed from home) BMI: 75.52 kg/m2    Lifestyle & Dietary Hx  Pt states he is trying to work towards doing things he wants to do and that will make him happy.   Pt states he has been out of his blood pressure medication. Pt states has eaten less fast food and had his first psychologist appt which he states went well. Pt states he was able to start a bedtime schedule with some success stating his next obstacle is to get out of bed in the morning. Pt states has been aiming for 4 bottles of water per day.   Tyeing into old behaviors helped him to accomplish new ones  Estimated daily fluid intake: 64+ oz Current average weekly physical activity: N/A  24-Hr Dietary Recall First Meal: skipped or boiled eggs Snack:  Second Meal: fast food or boiled eggs or granola bar or fresh [place] wraps or salads Snack:  Third Meal: fast food Snack 3am: half sandwich Beverages: water, diet sweet tea   NUTRITION DIAGNOSIS  Overweight/obesity ([place]-3.3) related to past poor dietary habits and physical inactivity as evidenced by patient w/ planned Sleeve surgery following dietary guidelines for continued weight loss.   NUTRITION INTERVENTION  Nutrition counseling (C-1) and education (E-2) to facilitate bariatric surgery goals.  Pre-Op Goals Progress & New Goals . Continue to work on eating 3 meals per day, including more structure in meal timing . Continue to work on making food at home and avoiding fried foods . Continue increasing water intake  . Continue to practice meal prepping . Continue: batch cook and eat the leftovers; aim to do this 2 times a  week . Continue: identify the barriers you have to making the changes . Continue: go to bed at certain time: 1 am: winding down at 1am and turn everything off by 2; ty this new behavior to old behaviors you have already have such as talking with your friend and her going to bed . NEW: get out of bed earlier in the morning try bed yoga  Learning Style & Readiness for Change Teaching method utilized: Visual & Auditory  Demonstrated degree of understanding via: Teach Back  Barriers to learning/adherence to lifestyle change: None Identified    MONITORING & EVALUATION Dietary intake, weekly physical activity, body weight, and pre-op goals in 1 month.   Next Steps  Patient is to return to [facility] in 1 month

## 2020-04-22 ENCOUNTER — Ambulatory Visit (INDEPENDENT_AMBULATORY_CARE_PROVIDER_SITE_OTHER): Payer: 59 | Admitting: Psychology

## 2020-04-22 DIAGNOSIS — F509 Eating disorder, unspecified: Secondary | ICD-10-CM | POA: Diagnosis not present

## 2020-05-06 ENCOUNTER — Other Ambulatory Visit: Payer: Self-pay

## 2020-05-06 ENCOUNTER — Encounter: Payer: 59 | Attending: General Surgery | Admitting: Skilled Nursing Facility1

## 2020-05-06 DIAGNOSIS — E669 Obesity, unspecified: Secondary | ICD-10-CM | POA: Insufficient documentation

## 2020-05-06 NOTE — Progress Notes (Signed)
Supervised Weight Loss Visit Bariatric Nutrition Education  Planned surgery: Sleeve Pt expectation of surgery: to get back into musical/art performance more; be more active; relieve back pain; travel more  6 out of 6 SWL Appointments   Pt completed visits.  Pt has cleared nutrition requirements.  NUTRITION ASSESSMENT  Anthropometrics  Start weight at [facility]: 550 lbs (date: 10/31/2019) Today's weight: 552.1 lbs (05/06/2020) BMI: 74.88 kg/m2    Lifestyle & Dietary Hx  Pt states he has been doing good. Pt states that he has been working on his goals: morning routine of getting up. Pt states he hasn't been able to do his bed yoga often. Pt states he has new CPAP. Pt states he's not interested in bed yoga, but is interested in chair exercises. Pt states that he has some videos Youtube for chair yoga. Pt states he is still focusing on less fast food. Pt states he has been good with this. Pt is feeling anxious and excited for surgery. Pt is feeling like he would like to talk to the surgeon about surgical procedures stating that he feels he will not lose as much weight as hoping with sleeve: RD educated pt on both surgeries being comparable and it's more important about behavior changes. Pt states he has been doing more meal prep, he states he does it a few times a week. Pt states he hasn't been able to do physical activity due to his knee causing pain. Pt states he's been doing better with eating breakfast.  Estimated daily fluid intake: 64+ oz Current average weekly physical activity: N/A  24-Hr Dietary Recall First Meal: eggs or special K cereal + almond milk Snack:  Second Meal: grilled chicken + low-sodium gravy + broccoli + cauliflower mashed potatoes  Snack: mixed nuts or a few crackers with pepperoni  Third Meal: baked chicken wings + salad (spinach mix + boiled eggs + pineapple + low cal dressing)   Snack 12 am or later: fat-free ice cream sandwiches Beverages: water, milo's sugar  free tea, apple juice, diet Dr. Reino Kent   NUTRITION DIAGNOSIS  Overweight/obesity ([place]-3.3) related to past poor dietary habits and physical inactivity as evidenced by patient w/ planned Sleeve surgery following dietary guidelines for continued weight loss.   NUTRITION INTERVENTION  Nutrition counseling (C-1) and education (E-2) to facilitate bariatric surgery goals.  Pre-Op Goals Progress & New Goals . Continue to work on eating 3 meals per day, including more structure in meal timing . Continue to work on making food at home and avoiding fried foods . Continue increasing water intake  . Continue to practice meal prepping . Continue: batch cook and eat the leftovers; aim to do this 2 times a week . Continue: identify the barriers you have to making the changes . Continue: go to bed at certain time: 1 am: winding down at 1am and turn everything off by 2; ty this new behavior to old behaviors you have already have such as talking with your friend and her going to bed . NEW: get out of bed earlier in the morning try chair exercises  Learning Style & Readiness for Change Teaching method utilized: Visual & Auditory  Demonstrated degree of understanding via: Teach Back  Barriers to learning/adherence to lifestyle change: None Identified    MONITORING & EVALUATION Dietary intake, weekly physical activity, body weight, and pre-op goals.  Next Steps  Patient is to follow up at [facility] for Pre-Op Class >2 weeks before surgery for further nutrition education.  Pt has  completed visits. No further supervised visits required.

## 2020-05-25 ENCOUNTER — Telehealth: Payer: Self-pay | Admitting: Pulmonary Disease

## 2020-05-25 NOTE — Telephone Encounter (Signed)
Called and spoke with pt and he stated that he has been using his CPAP since 3/25.  I have scheduled him an OV with AO for 05/10 to follow up after starting his CPAP.  He is requesting that this be either a telephone visit or video visit.  AO please advise. Thanks

## 2020-05-26 NOTE — Telephone Encounter (Signed)
May schedule with one of our APPs

## 2020-05-26 NOTE — Telephone Encounter (Signed)
lmtcb for pt.  

## 2020-05-28 NOTE — Telephone Encounter (Signed)
lmtcb for pt.  

## 2020-05-29 NOTE — Telephone Encounter (Signed)
Pt is scheduled for an appt with Dr. Val Eagle 5/10. Will close encounter.

## 2020-06-16 ENCOUNTER — Telehealth: Payer: Self-pay | Admitting: Pulmonary Disease

## 2020-06-16 ENCOUNTER — Other Ambulatory Visit: Payer: Self-pay

## 2020-06-16 ENCOUNTER — Encounter: Payer: Self-pay | Admitting: Pulmonary Disease

## 2020-06-16 ENCOUNTER — Ambulatory Visit (INDEPENDENT_AMBULATORY_CARE_PROVIDER_SITE_OTHER): Payer: 59 | Admitting: Pulmonary Disease

## 2020-06-16 DIAGNOSIS — Z9989 Dependence on other enabling machines and devices: Secondary | ICD-10-CM

## 2020-06-16 DIAGNOSIS — G4733 Obstructive sleep apnea (adult) (pediatric): Secondary | ICD-10-CM

## 2020-06-16 NOTE — Progress Notes (Signed)
Virtual Visit via Telephone Note  I connected with Brent Cole on 06/16/20 at  1:45 PM EDT by telephone and verified that I am speaking with the correct person using two identifiers.  Location: Patient: Brent Cole Provider: Stephannie Peters discussed the limitations, risks, security and privacy concerns of performing an evaluation and management service by telephone and the availability of in person appointments. I also discussed with the patient that there may be a patient responsible charge related to this service. The patient expressed understanding and agreed to proceed.   History of Present Illness:  Patient with severe obstructive sleep apnea Has been trying to get used to using CPAP on a nightly basis Currently on auto titrating CPAP settings of 5-20 Usually tries to get about 5 hours of sleep nightly Compliance data shows that he uses the machine for about 3 hours 53 minutes on days used  He has not noticed significant difference in how he feels generally when he wakes up in the morning despite trying to get used to using CPAP Has no significant mask issues  At some point was having difficulty with humidification  He finds adding distilled water to the system is uncomfortable for him  Has been trying to use without humidification   denies any other issues with his CPAP   Observations/Objective: Feels about the same  No significant sleepiness during the day  No changes in his health generally  Assessment and Plan: Severe obstructive sleep apnea  Compliance data shows 73% compliance, average use of 3 hours 53 minutes on days used, machine set at 5-20 Median pressure of 11.8, 95 percentile pressure of 14.7 Residual AHI of 2.1  Follow Up Instructions: Encouraged to continue using CPAP on a regular basis  Encouraged to get close to 6 hours of sleep, though states he is usually used to getting 5 hours   I discussed the assessment and treatment plan with the  patient. The patient was provided an opportunity to ask questions and all were answered. The patient agreed with the plan and demonstrated an understanding of the instructions.   The patient was advised to call back or seek an in-person evaluation if the symptoms worsen or if the condition fails to improve as anticipated.  I provided 20 minutes of non-face-to-face time during this encounter, previsit review of his records, review of in lab study and review of his compliance data.  I will see him back in about 6 months  Encouraged to call with any significant concerns   Tomma Lightning, MD

## 2020-06-16 NOTE — Telephone Encounter (Signed)
Called and spoke with Patient.  Patient requested his 1:45pm osa/cpap follow up appointment be a televisit.  Per Dr.Olalere, telephone OV is ok. Patient understands someone will call closer to 1:45pm today.  Nothing further at this time.

## 2020-10-08 ENCOUNTER — Ambulatory Visit (INDEPENDENT_AMBULATORY_CARE_PROVIDER_SITE_OTHER): Payer: 59 | Admitting: Psychology

## 2020-10-08 DIAGNOSIS — F509 Eating disorder, unspecified: Secondary | ICD-10-CM

## 2020-10-19 ENCOUNTER — Ambulatory Visit: Payer: 59 | Admitting: Skilled Nursing Facility1

## 2020-10-19 ENCOUNTER — Telehealth: Payer: 59 | Admitting: Skilled Nursing Facility1

## 2020-10-20 ENCOUNTER — Encounter: Payer: 59 | Attending: General Surgery | Admitting: Skilled Nursing Facility1

## 2020-10-20 DIAGNOSIS — E669 Obesity, unspecified: Secondary | ICD-10-CM | POA: Diagnosis not present

## 2020-10-20 NOTE — Progress Notes (Signed)
Supervised Weight Loss Visit Bariatric Nutrition Education  Planned surgery: Sleeve Pt expectation of surgery: to get back into musical/art performance more; be more active; relieve back pain; travel more  Updated Assessment letter  Pt completed visits.   Pt has cleared nutrition requirements.   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   Pt states int he last year he feels he has been successful with cutting out sugary drinks and increasing his overall movement and reading the nutrition label more often.   Pt states he thinks he might struggle with nothing after surgery stating he thinks he is too excited for surgery not to be motivated.   Pt states he will work on making meals to take with him to work since he is there so long in the day and then school after.   Estimated daily fluid intake: 64+ oz Current average weekly physical activity: N/A  24-Hr Dietary Recall: continued First Meal: eggs or special K cereal + almond milk Snack:  Second Meal: grilled chicken + low-sodium gravy + broccoli + cauliflower mashed potatoes  Snack: mixed nuts or a few crackers with pepperoni  Third Meal: baked chicken wings + salad (spinach mix + boiled eggs + pineapple + low cal dressing)   Snack 12 am or later: fat-free ice cream sandwiches Beverages: water, milo's sugar free tea, apple juice, diet Dr. Reino Kent   NUTRITION DIAGNOSIS  Overweight/obesity ([place]-3.3) related to past poor dietary habits and physical inactivity as evidenced by patient w/ planned Sleeve surgery following dietary guidelines for continued weight loss.   NUTRITION INTERVENTION  Nutrition counseling (C-1) and education (E-2) to facilitate bariatric surgery  goals.  Pre-Op Goals Progress & New Goals Continue to work on eating 3 meals per day, including more structure in meal timing Continue to work on making food at home and avoiding fried foods Continue increasing water intake  Continue to practice meal prepping Continue: batch cook and eat the leftovers; aim to do this 2 times a week Continue: identify the barriers you have to making the changes Continue: go to bed at certain time: 1 am: winding down at 1am and turn everything off by 2; ty this new behavior to old behaviors you have already have such as talking with your friend and her going to bed continue: get out of bed earlier in the morning try chair exercises  Learning Style & Readiness for Change Teaching method utilized: Visual & Auditory  Demonstrated degree of understanding via: Teach Back  Barriers to learning/adherence to lifestyle change: None Identified    MONITORING & EVALUATION Dietary intake, weekly physical activity, body weight, and pre-op goals.  Next Steps  Patient is to follow up at [facility] for Pre-Op Class >2 weeks before surgery for further nutrition education.  Pt has completed visits. No further supervised visits required.

## 2020-10-26 ENCOUNTER — Encounter: Payer: 59 | Admitting: Skilled Nursing Facility1

## 2020-10-26 ENCOUNTER — Other Ambulatory Visit: Payer: Self-pay

## 2020-10-26 DIAGNOSIS — E669 Obesity, unspecified: Secondary | ICD-10-CM

## 2020-10-26 NOTE — Progress Notes (Signed)
Pre-Operative Nutrition Class:    Patient was seen on 10/26/2020 for Pre-Operative Bariatric Surgery Education at [facility]    Surgery date:  Surgery type: sleeve Start weight at [facility]: 550 pounds Weight today: pt arrived too late to be weighed  Samples given per MNT protocol. Patient educated on appropriate usage: Bariatric Advantage Multivitamin Lot # [id number] Exp: 08/23   Procare Calcium  Lot # [id number] Exp: 03/23   Bariatric Advantage protein powder Lot # [id number] Exp: 10/23  The following the learning objectives were met by the patient during this course: Identify Pre-Op Dietary Goals and will begin 2 weeks pre-operatively Identify appropriate sources of fluids and proteins  State protein recommendations and appropriate sources pre and post-operatively Identify Post-Operative Dietary Goals and will follow for 2 weeks post-operatively Identify appropriate multivitamin and calcium sources Describe the need for physical activity post-operatively and will follow MD recommendations State when to call healthcare provider regarding medication questions or post-operative complications When having a diagnosis of diabetes understanding hypoglycemia symptoms and the inclusion of 1 complex carbohydrate per meal  Handouts given during class include: Pre-Op Bariatric Surgery Diet Handout Protein Shake Handout Post-Op Bariatric Surgery Nutrition Handout BELT Program Information Flyer Support Group Information Flyer [facility] Bariatric Supplements Price List  Follow-Up Plan: Patient will follow-up at [facility] 2 weeks post operatively for diet advancement per MD.

## 2020-11-10 NOTE — Progress Notes (Signed)
Sent message, via epic in basket, requesting orders in epic from surgeon.  

## 2020-11-12 ENCOUNTER — Ambulatory Visit: Payer: Self-pay | Admitting: General Surgery

## 2020-11-12 DIAGNOSIS — Z01818 Encounter for other preprocedural examination: Secondary | ICD-10-CM

## 2020-11-18 ENCOUNTER — Other Ambulatory Visit (HOSPITAL_COMMUNITY): Payer: Self-pay

## 2020-11-19 ENCOUNTER — Encounter (HOSPITAL_COMMUNITY): Admission: RE | Admit: 2020-11-19 | Payer: 59 | Source: Ambulatory Visit

## 2020-11-23 NOTE — Patient Instructions (Signed)
DUE TO COVID-19 ONLY ONE VISITOR IS ALLOWED TO COME WITH YOU AND STAY IN THE WAITING ROOM ONLY DURING PRE OP AND PROCEDURE.   **NO VISITORS ARE ALLOWED IN THE SHORT STAY AREA OR RECOVERY ROOM!!**  IF YOU WILL BE ADMITTED INTO THE HOSPITAL YOU ARE ALLOWED ONLY TWO SUPPORT PEOPLE DURING VISITATION HOURS ONLY (10AM -8PM)   The support person(s) may change daily. The support person(s) must pass our screening, gel in and out, and wear a mask at all times, including in the patient's room. Patients must also wear a mask when staff or their support person are in the room.  No visitors under the age of 4. Any visitor under the age of 37 must be accompanied by an adult.    COVID SWAB TESTING MUST BE COMPLETED ON:  11/26/20 **MUST PRESENT COMPLETED FORM AT TESTING SITE**    [address] [place] (backside of the building) You are not required to quarantine, however you are required to wear a well-fitted mask when you are out and around people not in your household.  Hand Hygiene often Do NOT share personal items Notify your provider if you are in close contact with someone who has COVID or you develop fever 100.4 or greater, new onset of sneezing, cough, sore throat, shortness of breath or body aches.  [facility] Entrance [address], [address], must go inside of the hospital, NOT A DRIVE THRU!  (Must self quarantine after testing. Follow instructions on handout.)       Your procedure is scheduled on: 11/30/20   Report to [facility] Main Entrance    Report to admitting at: 10:30 AM   Call this number if you have problems the morning of surgery [phone]  MORNING OF SURGERY DRINK:   DRINK 1 G2 drink BEFORE YOU LEAVE HOME, DRINK ALL OF THE  G2 DRINK AT ONE TIME ( 9:45 AM).   NO SOLID FOOD AFTER 600 PM THE NIGHT BEFORE YOUR SURGERY. YOU MAY DRINK CLEAR FLUIDS. THE G2 DRINK YOU DRINK BEFORE YOU LEAVE HOME WILL BE THE LAST FLUIDS  YOU DRINK BEFORE SURGERY.  PAIN IS EXPECTED AFTER SURGERY AND WILL NOT BE COMPLETELY ELIMINATED. AMBULATION AND TYLENOL WILL HELP REDUCE INCISIONAL AND GAS PAIN. MOVEMENT IS KEY!  YOU ARE EXPECTED TO BE OUT OF BED WITHIN 4 HOURS OF ADMISSION TO YOUR PATIENT ROOM.  SITTING IN THE RECLINER THROUGHOUT THE DAY IS IMPORTANT FOR DRINKING FLUIDS AND MOVING GAS THROUGHOUT THE GI TRACT.  COMPRESSION STOCKINGS SHOULD BE WORN [facility] STAY UNLESS YOU ARE WALKING.   INCENTIVE SPIROMETER SHOULD BE USED EVERY HOUR WHILE AWAKE TO DECREASE POST-OPERATIVE COMPLICATIONS SUCH AS PNEUMONIA.  WHEN DISCHARGED HOME, IT IS IMPORTANT TO CONTINUE TO WALK EVERY HOUR AND USE THE INCENTIVE SPIROMETER EVERY HOUR.  CLEAR LIQUID DIET  Foods Allowed                                                                     Foods Excluded  Water, Black Coffee and tea, regular and decaf  liquids that you cannot  Plain Jell-O in any flavor  (No red)                                           see through such as: Fruit ices (not with fruit pulp)                                     milk, soups, orange juice              Iced Popsicles (No red)                                    All solid food                                   Apple juices Sports drinks like Gatorade (No red) Lightly seasoned clear broth or consume(fat free) Sugar,  Sample Menu Breakfast                                Lunch                                     Supper Cranberry juice                    Beef broth                            Chicken broth Jell-O                                     Grape juice                           Apple juice Coffee or tea                        Jell-O                                      Popsicle                                                Coffee or tea                        Coffee or tea        The day of surgery:  Drink ONE (1) Pre-Surgery G2 by am the morning of surgery.  Drink in one sitting. Do not sip.  This drink was given to you during your hospital  pre-op appointment visit. Nothing else to drink after completing the  Pre-Surgery Clear Ensure or  G2.          If you have questions, please contact your surgeon's office.   Oral Hygiene is also important to reduce your risk of infection.                                    Remember - BRUSH YOUR TEETH THE MORNING OF SURGERY WITH YOUR REGULAR TOOTHPASTE   Do NOT smoke after Midnight   Take these medicines the morning of surgery with A SIP OF WATER: amlodipine.  DO NOT TAKE ANY ORAL DIABETIC MEDICATIONS DAY OF YOUR SURGERY                              You may not have any metal on your body including hair pins, jewelry, and body piercing             Do not wear lotions, powders, perfumes/cologne, or deodorant              Men may shave face and neck.   Do not bring valuables to the hospital. [facility] IS NOT             RESPONSIBLE   FOR VALUABLES.   Contacts, dentures or bridgework may not be worn into surgery.   Bring small overnight bag day of surgery.    Patients discharged on the day of surgery will not be allowed to drive home.   Special Instructions: Bring a copy of your healthcare power of attorney and living will documents         the day of surgery if you haven't scanned them before.              Please read over the following fact sheets you were given: IF YOU HAVE QUESTIONS ABOUT YOUR PRE-OP INSTRUCTIONS PLEASE CALL [phone]   [facility] - Preparing for Surgery Before surgery, you can play an important role.  Because skin is not sterile, your skin needs to be as free of germs as possible.  You can reduce the number of germs on your skin by washing with CHG (chlorahexidine gluconate) soap before surgery.  CHG is an antiseptic cleaner which kills germs and bonds with the skin to continue killing germs even after washing. Please DO NOT use if you have an allergy to CHG or  antibacterial soaps.  If your skin becomes reddened/irritated stop using the CHG and inform your nurse when you arrive at Short Stay. Do not shave (including legs and underarms) for at least 48 hours prior to the first CHG shower.  You may shave your face/neck. Please follow these instructions carefully:  1.  Shower with CHG Soap the night before surgery and the  morning of Surgery.  2.  If you choose to wash your hair, wash your hair first as usual with your  normal  shampoo.  3.  After you shampoo, rinse your hair and body thoroughly to remove the  shampoo.                           4.  Use CHG as you would any other liquid soap.  You can apply chg directly  to the skin and wash  Gently with a scrungie or clean washcloth.  5.  Apply the CHG Soap to your body ONLY FROM THE NECK DOWN.   Do not use on face/ open                           Wound or open sores. Avoid contact with eyes, ears mouth and genitals (private parts).                       Wash face,  Genitals (private parts) with your normal soap.             6.  Wash thoroughly, paying special attention to the area where your surgery  will be performed.  7.  Thoroughly rinse your body with warm water from the neck down.  8.  DO NOT shower/wash with your normal soap after using and rinsing off  the CHG Soap.                9.  Pat yourself dry with a clean towel.            10.  Wear clean pajamas.            11.  Place clean sheets on your bed the night of your first shower and do not  sleep with pets. Day of Surgery : Do not apply any lotions/deodorants the morning of surgery.  Please wear clean clothes to the hospital/surgery center.  FAILURE TO FOLLOW THESE INSTRUCTIONS MAY RESULT IN THE CANCELLATION OF YOUR SURGERY PATIENT SIGNATURE_________________________________  NURSE SIGNATURE__________________________________  ________________________________________________________________________

## 2020-11-24 ENCOUNTER — Other Ambulatory Visit: Payer: Self-pay

## 2020-11-24 ENCOUNTER — Encounter (HOSPITAL_COMMUNITY)
Admission: RE | Admit: 2020-11-24 | Discharge: 2020-11-24 | Disposition: A | Discharge status: Home / Self Care | Payer: 59 | Source: Ambulatory Visit | Attending: General Surgery | Admitting: General Surgery

## 2020-11-24 ENCOUNTER — Encounter (HOSPITAL_COMMUNITY): Payer: Self-pay

## 2020-11-24 DIAGNOSIS — Z01812 Encounter for preprocedural laboratory examination: Secondary | ICD-10-CM | POA: Diagnosis present

## 2020-11-24 DIAGNOSIS — G4730 Sleep apnea, unspecified: Secondary | ICD-10-CM | POA: Diagnosis not present

## 2020-11-24 DIAGNOSIS — I10 Essential (primary) hypertension: Secondary | ICD-10-CM | POA: Diagnosis not present

## 2020-11-24 DIAGNOSIS — Z79899 Other long term (current) drug therapy: Secondary | ICD-10-CM | POA: Insufficient documentation

## 2020-11-24 DIAGNOSIS — Z6841 Body Mass Index (BMI) 40.0 and over, adult: Secondary | ICD-10-CM | POA: Insufficient documentation

## 2020-11-24 DIAGNOSIS — Z01818 Encounter for other preprocedural examination: Secondary | ICD-10-CM

## 2020-11-24 LAB — COMPREHENSIVE METABOLIC PANEL
ALT: 35 U/L (ref 0–44)
AST: 35 U/L (ref 15–41)
Albumin: 3.5 g/dL (ref 3.5–5.0)
Alkaline Phosphatase: 45 U/L (ref 38–126)
Anion gap: 7 (ref 5–15)
BUN: 21 mg/dL — ABNORMAL HIGH (ref 6–20)
CO2: 28 mmol/L (ref 22–32)
Calcium: 8.4 mg/dL — ABNORMAL LOW (ref 8.9–10.3)
Chloride: 100 mmol/L (ref 98–111)
Creatinine, Ser: 1.12 mg/dL (ref 0.61–1.24)
GFR, Estimated: >60 mL/min (ref >60)
Glucose, Bld: 121 mg/dL — ABNORMAL HIGH (ref 70–99)
Potassium: 3.8 mmol/L (ref 3.5–5.1)
Sodium: 135 mmol/L (ref 135–145)
Total Bilirubin: 0.8 mg/dL (ref 0.3–1.2)
Total Protein: 7.9 g/dL (ref 6.5–8.1)

## 2020-11-24 LAB — CBC WITH DIFFERENTIAL/PLATELET
Abs Immature Granulocytes: 0.01 10*3/uL (ref 0.00–0.07)
Basophils Absolute: 0 10*3/uL (ref 0.0–0.1)
Basophils Relative: 0 %
Eosinophils Absolute: 0.1 10*3/uL (ref 0.0–0.5)
Eosinophils Relative: 2 %
HCT: 41.5 % (ref 39.0–52.0)
Hemoglobin: 12.8 g/dL — ABNORMAL LOW (ref 13.0–17.0)
Immature Granulocytes: 0 %
Lymphocytes Relative: 29 %
Lymphs Abs: 1.1 10*3/uL (ref 0.7–4.0)
MCH: 24.3 pg — ABNORMAL LOW (ref 26.0–34.0)
MCHC: 30.8 g/dL (ref 30.0–36.0)
MCV: 78.7 fL — ABNORMAL LOW (ref 80.0–100.0)
Monocytes Absolute: 0.5 10*3/uL (ref 0.1–1.0)
Monocytes Relative: 12 %
Neutro Abs: 2.1 10*3/uL (ref 1.7–7.7)
Neutrophils Relative %: 57 %
Platelets: 201 10*3/uL (ref 150–400)
RBC: 5.27 MIL/uL (ref 4.22–5.81)
RDW: 16.5 % — ABNORMAL HIGH (ref 11.5–15.5)
WBC: 3.7 10*3/uL — ABNORMAL LOW (ref 4.0–10.5)
nRBC: 0 % (ref 0.0–0.2)

## 2020-11-24 NOTE — Progress Notes (Signed)
COVID Vaccine Completed: Yes Date COVID Vaccine completed: 12/25/19. X 3 COVID vaccine manufacturer: Moderna    COVID Test: 11/26/20 PCP -  Cardiologist -   Chest x-ray - 01/13/20 EKG - 01/13/20 Stress Test -  ECHO -  Cardiac Cath -  Pacemaker/ICD device last checked:  Sleep Study - Yes CPAP - Yes  Fasting Blood Sugar -  Checks Blood Sugar _____ times a day  Blood Thinner Instructions: Aspirin Instructions: Last Dose:  Anesthesia review: Hx: HTN,OSA (CPAP)  Patient denies shortness of breath, fever, cough and chest pain at PAT appointment   Patient verbalized understanding of instructions that were given to them at the PAT appointment. Patient was also instructed that they will need to review over the PAT instructions again at home before surgery.

## 2020-11-25 NOTE — Progress Notes (Signed)
Anesthesia Chart Review   Case: [id number] Date/Time: 11/30/20 1230   Procedures:      LAPAROSCOPIC GASTRIC SLEEVE RESECTION     UPPER GI ENDOSCOPY   Anesthesia type: General   Pre-op diagnosis: morbid obesity   Location: [facility] ROOM 02 / [facility]   Surgeons: [name], MD       DISCUSSION:45 y.o. never smoker with h/o HTN, sleep apnea, morbid obesity scheduled for above procedure 11/30/2020 with Dr. [name].   Pt seen by cardiology 10/21/2019 for preop evaluation.  Per OV note, "-The Revised Cardiac Risk Index = 0 which equates to 0.4% (very low risk) estimated risk of perioperative myocardial infarction, pulmonary edema, ventricular fibrillation, cardiac arrest, or complete heart block.  -His EKG is normal.  His cardiovascular exam is without murmurs rubs or gallops.  He does have lymphedema in the left lower extremity which is obesity related.  He has no evidence of heart failure on exam. -He is high risk for bariatric surgery given his super morbid obesity.  His BMI is 75.  His weight is 555 pounds.  His weight puts him at considerable pulmonary risk. -He is able to climb 1 flight of stairs without any chest pain or shortness of breath.  I think he will do fine from a cardiovascular standpoint however I do worry about him from pulmonary standpoint regarding his weight.  He also needs a sleep apnea evaluated. -Regarding his future surgery I would recommend no further cardiac testing.  His weight does preclude accurate assessment on most stress testing.  I viewed his surgery is urgent given his weight of 555 pounds I would not recommend further testing. -He should continue his current medications.  I think weight loss is indicated as this will improve his diabetes as well as his high blood pressure.  Our team is available in the perioperative period if needed."  Pt did undergo sleep study and started on CPAP.  Followed by Dr. Wynona Neat.    Anticipate pt can proceed with planned procedure  barring acute status change and after evaluation DOS.  VS: BP (!) 161/108   Pulse 89   Temp 36.7 C (Oral)   Ht 6' (1.829 m)   Wt (!) 244.9 kg   SpO2 93%   BMI 73.24 kg/m   PROVIDERS: Renford Dills, MD is PCP    LABS: Labs reviewed: Acceptable for surgery. (all labs ordered are listed, but only abnormal results are displayed)  Labs Reviewed  CBC WITH DIFFERENTIAL/PLATELET - Abnormal; Notable for the following components:      Result Value   WBC 3.7 (*)    Hemoglobin 12.8 (*)    MCV 78.7 (*)    MCH 24.3 (*)    RDW 16.5 (*)    All other components within normal limits  COMPREHENSIVE METABOLIC PANEL - Abnormal; Notable for the following components:   Glucose, Bld 121 (*)    BUN 21 (*)    Calcium 8.4 (*)    All other components within normal limits  TYPE AND SCREEN     IMAGES:   EKG: 01/13/2020 Rate 91 bpm  NSR No significant change since last tracing   CV:  Past Medical History:  Diagnosis Date   ACL tear    left knee - no surgery - physical therapy   Arthritis    left knee   History of pneumonia as a child    Hyperlipidemia    Hypertension    Sleep apnea    Sleep apnnea  currently not treated, pt. reports that he had CPAP at one time but he knows he needs to be tested  again    Past Surgical History:  Procedure Laterality Date   BACK SURGERY     Bilateral laminectomy decompression   01/2016    MEDICATIONS:  amLODipine (NORVASC) 10 MG tablet   losartan-hydrochlorothiazide (HYZAAR) 100-25 MG tablet   No current facility-administered medications for this encounter.   Jodell Cipro Ward, PA-C [facility]Surgical Testing [phone]

## 2020-11-25 NOTE — Anesthesia Preprocedure Evaluation (Addendum)
Anesthesia Evaluation  Patient identified by MRN, date of birth, ID band Patient awake    Reviewed: Allergy & Precautions, NPO status , Patient's Chart, lab work & pertinent test results  Airway Mallampati: II  TM Distance: >3 FB Neck ROM: Full    Dental no notable dental hx. (+) Teeth Intact, Chipped,    Pulmonary sleep apnea (occasionally compliant w/ CPAP) ,    Pulmonary exam normal breath sounds clear to auscultation       Cardiovascular hypertension (normally 150 SBP per pt, 170 SBP in preop today), Pt. on medications Normal cardiovascular exam Rhythm:Regular Rate:Normal     Neuro/Psych negative neurological ROS  negative psych ROS   GI/Hepatic negative GI ROS, Neg liver ROS,   Endo/Other  Morbid obesityBMI 70  Renal/GU negative Renal ROS  negative genitourinary   Musculoskeletal  (+) Arthritis , Osteoarthritis,    Abdominal (+) + obese,   Peds negative pediatric ROS (+)  Hematology negative hematology ROS (+) hct 41.5   Anesthesia Other Findings   Reproductive/Obstetrics negative OB ROS                            Anesthesia Physical Anesthesia Plan  ASA: 4  Anesthesia Plan: General   Post-op Pain Management:    Induction: Intravenous  PONV Risk Score and Plan: Ondansetron, Dexamethasone, Midazolam, Treatment may vary due to age or medical condition, Scopolamine patch - Pre-op and Aprepitant  Airway Management Planned: Oral ETT  Additional Equipment: None  Intra-op Plan:   Post-operative Plan: Extubation in OR  Informed Consent: I have reviewed the patients History and Physical, chart, labs and discussed the procedure including the risks, benefits and alternatives for the proposed anesthesia with the patient or authorized representative who has indicated his/her understanding and acceptance.     Dental advisory given  Plan Discussed with: CRNA  Anesthesia Plan  Comments:        Anesthesia Quick Evaluation

## 2020-11-26 ENCOUNTER — Other Ambulatory Visit (HOSPITAL_COMMUNITY): Payer: Self-pay

## 2020-11-30 ENCOUNTER — Inpatient Hospital Stay (HOSPITAL_COMMUNITY): Payer: 59 | Admitting: Physician Assistant

## 2020-11-30 ENCOUNTER — Encounter (HOSPITAL_COMMUNITY)
Admission: RE | Disposition: A | Discharge status: Home / Self Care | Payer: Self-pay | Source: Home / Self Care | Attending: General Surgery

## 2020-11-30 ENCOUNTER — Encounter (HOSPITAL_COMMUNITY): Payer: Self-pay | Admitting: General Surgery

## 2020-11-30 ENCOUNTER — Inpatient Hospital Stay (HOSPITAL_COMMUNITY)
Admission: RE | Admit: 2020-11-30 | Discharge: 2020-12-01 | DRG: 621 | Disposition: A | Discharge status: Home / Self Care | Payer: 59 | Attending: General Surgery | Admitting: General Surgery

## 2020-11-30 ENCOUNTER — Inpatient Hospital Stay (HOSPITAL_COMMUNITY): Payer: 59 | Admitting: Certified Registered Nurse Anesthetist

## 2020-11-30 DIAGNOSIS — Z01818 Encounter for other preprocedural examination: Secondary | ICD-10-CM

## 2020-11-30 DIAGNOSIS — G4730 Sleep apnea, unspecified: Secondary | ICD-10-CM | POA: Diagnosis present

## 2020-11-30 DIAGNOSIS — I10 Essential (primary) hypertension: Secondary | ICD-10-CM | POA: Diagnosis present

## 2020-11-30 DIAGNOSIS — E785 Hyperlipidemia, unspecified: Secondary | ICD-10-CM | POA: Diagnosis present

## 2020-11-30 DIAGNOSIS — R7303 Prediabetes: Secondary | ICD-10-CM | POA: Diagnosis present

## 2020-11-30 DIAGNOSIS — G4733 Obstructive sleep apnea (adult) (pediatric): Secondary | ICD-10-CM | POA: Diagnosis present

## 2020-11-30 DIAGNOSIS — Z9884 Bariatric surgery status: Secondary | ICD-10-CM

## 2020-11-30 DIAGNOSIS — Z6841 Body Mass Index (BMI) 40.0 and over, adult: Secondary | ICD-10-CM

## 2020-11-30 DIAGNOSIS — Z8249 Family history of ischemic heart disease and other diseases of the circulatory system: Secondary | ICD-10-CM | POA: Diagnosis not present

## 2020-11-30 DIAGNOSIS — M1990 Unspecified osteoarthritis, unspecified site: Secondary | ICD-10-CM | POA: Diagnosis present

## 2020-11-30 DIAGNOSIS — Z79899 Other long term (current) drug therapy: Secondary | ICD-10-CM | POA: Diagnosis not present

## 2020-11-30 DIAGNOSIS — Z20822 Contact with and (suspected) exposure to covid-19: Secondary | ICD-10-CM | POA: Diagnosis present

## 2020-11-30 DIAGNOSIS — G8929 Other chronic pain: Secondary | ICD-10-CM | POA: Diagnosis present

## 2020-11-30 HISTORY — PX: UPPER GI ENDOSCOPY: SHX6162

## 2020-11-30 LAB — TYPE AND SCREEN
ABO/RH(D): B POS
Antibody Screen: NEGATIVE

## 2020-11-30 LAB — HEMOGLOBIN AND HEMATOCRIT, BLOOD
HCT: 45.6 % (ref 39.0–52.0)
Hemoglobin: 13.6 g/dL (ref 13.0–17.0)

## 2020-11-30 LAB — GLUCOSE, CAPILLARY
Glucose-Capillary: 116 mg/dL — ABNORMAL HIGH (ref 70–99)
Glucose-Capillary: 126 mg/dL — ABNORMAL HIGH (ref 70–99)
Glucose-Capillary: 130 mg/dL — ABNORMAL HIGH (ref 70–99)
Glucose-Capillary: 149 mg/dL — ABNORMAL HIGH (ref 70–99)

## 2020-11-30 LAB — HEMOGLOBIN A1C
Hgb A1c MFr Bld: 6.1 % — ABNORMAL HIGH (ref 4.8–5.6)
Mean Plasma Glucose: 128.37 mg/dL

## 2020-11-30 LAB — SARS CORONAVIRUS 2 BY RT PCR (HOSPITAL ORDER, PERFORMED IN ~~LOC~~ HOSPITAL LAB): SARS Coronavirus 2: NEGATIVE

## 2020-11-30 SURGERY — GASTRECTOMY, PARTIAL, ROBOT-ASSISTED
Anesthesia: General | Site: Abdomen

## 2020-11-30 MED ORDER — KETAMINE HCL 10 MG/ML IJ SOLN
INTRAMUSCULAR | Status: AC
  Filled 2020-11-30: qty 1

## 2020-11-30 MED ORDER — LACTATED RINGERS IV SOLN: INTRAVENOUS | Status: DC

## 2020-11-30 MED ORDER — ENOXAPARIN (LOVENOX) PATIENT EDUCATION KIT
PACK | Freq: Once | Status: AC
  Filled 2020-11-30: qty 1

## 2020-11-30 MED ORDER — ACETAMINOPHEN 160 MG/5ML PO SOLN: 1000.0000 mg | Freq: Three times a day (TID) | ORAL | Status: DC

## 2020-11-30 MED ORDER — PROMETHAZINE HCL 25 MG/ML IJ SOLN: 6.2500 mg | INTRAMUSCULAR | Status: DC | PRN

## 2020-11-30 MED ORDER — GABAPENTIN 100 MG PO CAPS
200.0000 mg | ORAL_CAPSULE | Freq: Two times a day (BID) | ORAL | Status: DC
  Administered 2020-11-30 – 2020-12-01 (×2): 200 mg via ORAL
  Filled 2020-11-30 (×2): qty 2

## 2020-11-30 MED ORDER — OXYCODONE HCL 5 MG PO TABS
5.0000 mg | ORAL_TABLET | Freq: Once | ORAL | Status: DC | PRN
Start: 2020-11-30 — End: 2020-11-30

## 2020-11-30 MED ORDER — ORAL CARE MOUTH RINSE: 15.0000 mL | Freq: Once | OROMUCOSAL | Status: AC

## 2020-11-30 MED ORDER — AMLODIPINE BESYLATE 10 MG PO TABS
10.0000 mg | ORAL_TABLET | Freq: Every day | ORAL | Status: DC
  Administered 2020-12-01: 10 mg via ORAL
  Filled 2020-11-30: qty 1

## 2020-11-30 MED ORDER — ROCURONIUM BROMIDE 10 MG/ML (PF) SYRINGE
PREFILLED_SYRINGE | INTRAVENOUS | Status: AC
  Filled 2020-11-30: qty 30

## 2020-11-30 MED ORDER — CHLORHEXIDINE GLUCONATE 0.12 % MT SOLN
15.0000 mL | Freq: Once | OROMUCOSAL | Status: AC
  Administered 2020-11-30: 15 mL via OROMUCOSAL

## 2020-11-30 MED ORDER — ONDANSETRON HCL 4 MG/2ML IJ SOLN: 4.0000 mg | Freq: Four times a day (QID) | INTRAMUSCULAR | Status: DC | PRN

## 2020-11-30 MED ORDER — MIDAZOLAM HCL 2 MG/2ML IJ SOLN
INTRAMUSCULAR | Status: AC
  Filled 2020-11-30: qty 2

## 2020-11-30 MED ORDER — CHLORHEXIDINE GLUCONATE 4 % EX LIQD: 60.0000 mL | Freq: Once | CUTANEOUS | Status: DC

## 2020-11-30 MED ORDER — FENTANYL CITRATE (PF) 100 MCG/2ML IJ SOLN
INTRAMUSCULAR | Status: AC
  Filled 2020-11-30: qty 2

## 2020-11-30 MED ORDER — HYDROMORPHONE HCL 1 MG/ML IJ SOLN: 0.2500 mg | INTRAMUSCULAR | Status: DC | PRN

## 2020-11-30 MED ORDER — DEXAMETHASONE SODIUM PHOSPHATE 4 MG/ML IJ SOLN: 4.0000 mg | INTRAMUSCULAR | Status: DC

## 2020-11-30 MED ORDER — SCOPOLAMINE 1 MG/3DAYS TD PT72
1.0000 | MEDICATED_PATCH | TRANSDERMAL | Status: DC
  Administered 2020-11-30: 1.5 mg via TRANSDERMAL
  Filled 2020-11-30: qty 1

## 2020-11-30 MED ORDER — LOSARTAN POTASSIUM-HCTZ 100-25 MG PO TABS: 1.0000 | ORAL_TABLET | Freq: Every day | ORAL | Status: DC

## 2020-11-30 MED ORDER — BUPIVACAINE LIPOSOME 1.3 % IJ SUSP
INTRAMUSCULAR | Status: AC
  Filled 2020-11-30: qty 20

## 2020-11-30 MED ORDER — ROCURONIUM BROMIDE 10 MG/ML (PF) SYRINGE
PREFILLED_SYRINGE | INTRAVENOUS | Status: AC
  Filled 2020-11-30: qty 10

## 2020-11-30 MED ORDER — ROCURONIUM BROMIDE 10 MG/ML (PF) SYRINGE
PREFILLED_SYRINGE | INTRAVENOUS | Status: DC | PRN
  Administered 2020-11-30: 100 mg via INTRAVENOUS
  Administered 2020-11-30: 20 mg via INTRAVENOUS

## 2020-11-30 MED ORDER — HYDROCHLOROTHIAZIDE 25 MG PO TABS
25.0000 mg | ORAL_TABLET | Freq: Every day | ORAL | Status: DC
  Administered 2020-12-01: 25 mg via ORAL
  Filled 2020-11-30: qty 1

## 2020-11-30 MED ORDER — DEXAMETHASONE SODIUM PHOSPHATE 10 MG/ML IJ SOLN
INTRAMUSCULAR | Status: AC
  Filled 2020-11-30: qty 1

## 2020-11-30 MED ORDER — SODIUM CHLORIDE 0.9% FLUSH
INTRAVENOUS | Status: DC | PRN
  Administered 2020-11-30: 50 mL

## 2020-11-30 MED ORDER — PANTOPRAZOLE SODIUM 40 MG IV SOLR
40.0000 mg | Freq: Every day | INTRAVENOUS | Status: DC
  Administered 2020-11-30: 40 mg via INTRAVENOUS
  Filled 2020-11-30: qty 40

## 2020-11-30 MED ORDER — 0.9 % SODIUM CHLORIDE (POUR BTL) OPTIME
TOPICAL | Status: DC | PRN
  Administered 2020-11-30: 1000 mL

## 2020-11-30 MED ORDER — SODIUM CHLORIDE 0.9 % IV SOLN
12.5000 mg | Freq: Four times a day (QID) | INTRAVENOUS | Status: DC | PRN
  Filled 2020-11-30: qty 0.5

## 2020-11-30 MED ORDER — EPHEDRINE SULFATE-NACL 50-0.9 MG/10ML-% IV SOSY
PREFILLED_SYRINGE | INTRAVENOUS | Status: DC | PRN
  Administered 2020-11-30: 10 mg via INTRAVENOUS

## 2020-11-30 MED ORDER — MIDAZOLAM HCL 5 MG/5ML IJ SOLN
INTRAMUSCULAR | Status: DC | PRN
  Administered 2020-11-30: 2 mg via INTRAVENOUS

## 2020-11-30 MED ORDER — KETAMINE HCL 10 MG/ML IJ SOLN
INTRAMUSCULAR | Status: DC | PRN
  Administered 2020-11-30: 40 mg via INTRAVENOUS

## 2020-11-30 MED ORDER — INSULIN ASPART 100 UNIT/ML IJ SOLN
0.0000 [IU] | INTRAMUSCULAR | Status: DC
  Administered 2020-11-30 – 2020-12-01 (×4): 3 [IU] via SUBCUTANEOUS

## 2020-11-30 MED ORDER — SUGAMMADEX SODIUM 500 MG/5ML IV SOLN
INTRAVENOUS | Status: AC
  Filled 2020-11-30: qty 5

## 2020-11-30 MED ORDER — SODIUM CHLORIDE 0.9 % IV SOLN
2.0000 g | INTRAVENOUS | Status: AC
  Administered 2020-11-30: 2 g via INTRAVENOUS
  Filled 2020-11-30: qty 2

## 2020-11-30 MED ORDER — OXYCODONE HCL 5 MG/5ML PO SOLN
5.0000 mg | Freq: Four times a day (QID) | ORAL | Status: DC | PRN
Start: 2020-11-30 — End: 2020-12-02

## 2020-11-30 MED ORDER — GABAPENTIN 300 MG PO CAPS
300.0000 mg | ORAL_CAPSULE | ORAL | Status: AC
  Administered 2020-11-30: 300 mg via ORAL
  Filled 2020-11-30: qty 1

## 2020-11-30 MED ORDER — PROPOFOL 10 MG/ML IV BOLUS
INTRAVENOUS | Status: AC
  Filled 2020-11-30: qty 20

## 2020-11-30 MED ORDER — DIPHENHYDRAMINE HCL 50 MG/ML IJ SOLN: 12.5000 mg | Freq: Three times a day (TID) | INTRAMUSCULAR | Status: DC | PRN

## 2020-11-30 MED ORDER — FENTANYL CITRATE (PF) 100 MCG/2ML IJ SOLN
INTRAMUSCULAR | Status: DC | PRN
  Administered 2020-11-30 (×4): 50 ug via INTRAVENOUS

## 2020-11-30 MED ORDER — APREPITANT 40 MG PO CAPS
40.0000 mg | ORAL_CAPSULE | ORAL | Status: AC
  Administered 2020-11-30: 40 mg via ORAL
  Filled 2020-11-30: qty 1

## 2020-11-30 MED ORDER — DEXAMETHASONE SODIUM PHOSPHATE 10 MG/ML IJ SOLN
INTRAMUSCULAR | Status: DC | PRN
  Administered 2020-11-30: 10 mg via INTRAVENOUS

## 2020-11-30 MED ORDER — OXYCODONE HCL 5 MG/5ML PO SOLN: 5.0000 mg | Freq: Once | ORAL | Status: DC | PRN

## 2020-11-30 MED ORDER — BUPIVACAINE LIPOSOME 1.3 % IJ SUSP: 20.0000 mL | Freq: Once | INTRAMUSCULAR | Status: DC

## 2020-11-30 MED ORDER — MEPERIDINE HCL 50 MG/ML IJ SOLN: 6.2500 mg | INTRAMUSCULAR | Status: DC | PRN

## 2020-11-30 MED ORDER — LACTATED RINGERS IR SOLN
Status: DC | PRN
  Administered 2020-11-30: 1000 mL

## 2020-11-30 MED ORDER — LOSARTAN POTASSIUM 50 MG PO TABS
100.0000 mg | ORAL_TABLET | Freq: Every day | ORAL | Status: DC
  Administered 2020-12-01: 100 mg via ORAL
  Filled 2020-11-30: qty 2

## 2020-11-30 MED ORDER — MORPHINE SULFATE (PF) 2 MG/ML IV SOLN: 1.0000 mg | INTRAVENOUS | Status: DC | PRN

## 2020-11-30 MED ORDER — HYDRALAZINE HCL 20 MG/ML IJ SOLN
10.0000 mg | INTRAMUSCULAR | Status: DC | PRN
Start: 2020-11-30 — End: 2020-12-02

## 2020-11-30 MED ORDER — SIMETHICONE 80 MG PO CHEW: 80.0000 mg | CHEWABLE_TABLET | Freq: Four times a day (QID) | ORAL | Status: DC | PRN

## 2020-11-30 MED ORDER — HEPARIN SODIUM (PORCINE) 5000 UNIT/ML IJ SOLN
5000.0000 [IU] | INTRAMUSCULAR | Status: AC
  Administered 2020-11-30: 5000 [IU] via SUBCUTANEOUS
  Filled 2020-11-30: qty 1

## 2020-11-30 MED ORDER — LIDOCAINE HCL 2 % IJ SOLN
INTRAMUSCULAR | Status: AC
  Filled 2020-11-30: qty 20

## 2020-11-30 MED ORDER — LIDOCAINE 2% (20 MG/ML) 5 ML SYRINGE
INTRAMUSCULAR | Status: DC | PRN
  Administered 2020-11-30: 60 mg via INTRAVENOUS

## 2020-11-30 MED ORDER — ACETAMINOPHEN 500 MG PO TABS
1000.0000 mg | ORAL_TABLET | ORAL | Status: AC
  Administered 2020-11-30: 1000 mg via ORAL
  Filled 2020-11-30: qty 2

## 2020-11-30 MED ORDER — LIDOCAINE HCL (PF) 2 % IJ SOLN
INTRAMUSCULAR | Status: DC | PRN
  Administered 2020-11-30: .5 mg/kg/h via INTRADERMAL

## 2020-11-30 MED ORDER — PROPOFOL 10 MG/ML IV BOLUS
INTRAVENOUS | Status: DC | PRN
  Administered 2020-11-30: 300 mg via INTRAVENOUS

## 2020-11-30 MED ORDER — ENSURE MAX PROTEIN PO LIQD
2.0000 [oz_av] | ORAL | Status: DC
  Administered 2020-12-01 (×5): 2 [oz_av] via ORAL

## 2020-11-30 MED ORDER — POTASSIUM CHLORIDE IN NACL 20-0.45 MEQ/L-% IV SOLN
INTRAVENOUS | Status: DC
  Filled 2020-11-30 (×4): qty 1000

## 2020-11-30 MED ORDER — BUPIVACAINE LIPOSOME 1.3 % IJ SUSP
INTRAMUSCULAR | Status: DC | PRN
  Administered 2020-11-30: 20 mL

## 2020-11-30 MED ORDER — ONDANSETRON HCL 4 MG/2ML IJ SOLN
INTRAMUSCULAR | Status: AC
  Filled 2020-11-30: qty 2

## 2020-11-30 MED ORDER — ACETAMINOPHEN 500 MG PO TABS
1000.0000 mg | ORAL_TABLET | Freq: Three times a day (TID) | ORAL | Status: DC
  Administered 2020-11-30 – 2020-12-01 (×2): 1000 mg via ORAL
  Filled 2020-11-30 (×2): qty 2

## 2020-11-30 MED ORDER — ENOXAPARIN SODIUM 30 MG/0.3ML IJ SOSY
30.0000 mg | PREFILLED_SYRINGE | Freq: Two times a day (BID) | INTRAMUSCULAR | Status: DC
  Administered 2020-11-30 – 2020-12-01 (×3): 30 mg via SUBCUTANEOUS
  Filled 2020-11-30 (×3): qty 0.3

## 2020-11-30 SURGICAL SUPPLY — 72 items
APPLIER CLIP 5 13 M/L LIGAMAX5 (MISCELLANEOUS)
APPLIER CLIP ROT 13.4 12 LRG (CLIP)
BAG COUNTER SPONGE SURGICOUNT (BAG) ×3 IMPLANT
BLADE SURG SZ11 CARB STEEL (BLADE) ×3 IMPLANT
CANNULA REDUC XI 12-8 STAPL (CANNULA) ×1
CANNULA REDUCER 12-8 DVNC XI (CANNULA) ×2 IMPLANT
CHLORAPREP W/TINT 26 (MISCELLANEOUS) ×6 IMPLANT
CLIP APPLIE 5 13 M/L LIGAMAX5 (MISCELLANEOUS) IMPLANT
CLIP APPLIE ROT 13.4 12 LRG (CLIP) IMPLANT
COVER SURGICAL LIGHT HANDLE (MISCELLANEOUS) ×3 IMPLANT
COVER TIP SHEARS 8 DVNC (MISCELLANEOUS) IMPLANT
COVER TIP SHEARS 8MM DA VINCI (MISCELLANEOUS)
DECANTER SPIKE VIAL GLASS SM (MISCELLANEOUS) ×3 IMPLANT
DERMABOND ADVANCED (GAUZE/BANDAGES/DRESSINGS)
DERMABOND ADVANCED .7 DNX12 (GAUZE/BANDAGES/DRESSINGS) IMPLANT
DRAPE ARM DVNC X/XI (DISPOSABLE) ×8 IMPLANT
DRAPE COLUMN DVNC XI (DISPOSABLE) ×2 IMPLANT
DRAPE DA VINCI XI ARM (DISPOSABLE) ×4
DRAPE DA VINCI XI COLUMN (DISPOSABLE) ×1
DRSG TEGADERM 2-3/8X2-3/4 SM (GAUZE/BANDAGES/DRESSINGS) IMPLANT
ELECT REM PT RETURN 15FT ADLT (MISCELLANEOUS) ×3 IMPLANT
ENDOLOOP SUT PDS II  0 18 (SUTURE)
ENDOLOOP SUT PDS II 0 18 (SUTURE) IMPLANT
GAUZE SPONGE 2X2 8PLY STRL LF (GAUZE/BANDAGES/DRESSINGS) IMPLANT
GLOVE SRG 8 PF TXTR STRL LF DI (GLOVE) ×4 IMPLANT
GLOVE SURG POLY ORTHO LF SZ7.5 (GLOVE) ×6 IMPLANT
GLOVE SURG UNDER POLY LF SZ8 (GLOVE) ×2
GOWN STRL REUS W/TWL XL LVL3 (GOWN DISPOSABLE) ×6 IMPLANT
GRASPER SUT TROCAR 14GX15 (MISCELLANEOUS) ×3 IMPLANT
IRRIG SUCT STRYKERFLOW 2 WTIP (MISCELLANEOUS) ×3
IRRIGATION SUCT STRKRFLW 2 WTP (MISCELLANEOUS) ×2 IMPLANT
KIT BASIN OR (CUSTOM PROCEDURE TRAY) ×3 IMPLANT
MARKER SKIN DUAL TIP RULER LAB (MISCELLANEOUS) ×3 IMPLANT
MAT PREVALON FULL STRYKER (MISCELLANEOUS) ×3 IMPLANT
NEEDLE SPNL 22GX3.5 QUINCKE BK (NEEDLE) ×3 IMPLANT
OBTURATOR OPTICAL STANDARD 8MM (TROCAR)
OBTURATOR OPTICAL STND 8 DVNC (TROCAR)
OBTURATOR OPTICALSTD 8 DVNC (TROCAR) IMPLANT
PACK CARDIOVASCULAR III (CUSTOM PROCEDURE TRAY) ×3 IMPLANT
RELOAD STAPLER 2.5X60 WHT DVNC (STAPLE) ×10 IMPLANT
RELOAD STAPLER 3.5X60 BLU DVNC (STAPLE) ×2 IMPLANT
RELOAD STAPLER 4.3X60 GRN DVNC (STAPLE) ×2 IMPLANT
SCISSORS LAP 5X35 DISP (ENDOMECHANICALS) IMPLANT
SEAL CANN UNIV 5-8 DVNC XI (MISCELLANEOUS) ×6 IMPLANT
SEAL XI 5MM-8MM UNIVERSAL (MISCELLANEOUS) ×3
SEALER VESSEL DA VINCI XI (MISCELLANEOUS) ×1
SEALER VESSEL EXT DVNC XI (MISCELLANEOUS) ×2 IMPLANT
SLEEVE GASTRECTOMY 40FR VISIGI (MISCELLANEOUS) ×3 IMPLANT
SOL ANTI FOG 6CC (MISCELLANEOUS) ×2 IMPLANT
SOLUTION ANTI FOG 6CC (MISCELLANEOUS) ×1
SOLUTION ELECTROLUBE (MISCELLANEOUS) ×3 IMPLANT
SPONGE GAUZE 2X2 STER 10/PKG (GAUZE/BANDAGES/DRESSINGS)
SPONGE T-LAP 18X18 ~~LOC~~+RFID (SPONGE) ×3 IMPLANT
STAPLER 60 DA VINCI SURE FORM (STAPLE) ×1
STAPLER 60 SUREFORM DVNC (STAPLE) ×2 IMPLANT
STAPLER CANNULA SEAL DVNC XI (STAPLE) ×2 IMPLANT
STAPLER CANNULA SEAL XI (STAPLE) ×1
STAPLER RELOAD 2.5X60 WHITE (STAPLE) ×5
STAPLER RELOAD 2.5X60 WHT DVNC (STAPLE) ×10
STAPLER RELOAD 3.5X60 BLU DVNC (STAPLE) ×2
STAPLER RELOAD 3.5X60 BLUE (STAPLE) ×1
STAPLER RELOAD 4.3X60 GREEN (STAPLE) ×1
STAPLER RELOAD 4.3X60 GRN DVNC (STAPLE) ×2
SUT MNCRL AB 4-0 PS2 18 (SUTURE) ×3 IMPLANT
SUT VICRYL 0 TIES 12 18 (SUTURE) ×3 IMPLANT
SUT VLOC 180 2-0 9IN GS21 (SUTURE) IMPLANT
SYR 20ML LL LF (SYRINGE) ×3 IMPLANT
TOWEL OR 17X26 10 PK STRL BLUE (TOWEL DISPOSABLE) ×3 IMPLANT
TOWEL OR NON WOVEN STRL DISP B (DISPOSABLE) IMPLANT
TRAY FOLEY MTR SLVR 16FR STAT (SET/KITS/TRAYS/PACK) IMPLANT
TROCAR BLADELESS OPT 5 100 (ENDOMECHANICALS) ×3 IMPLANT
TUBING INSUFFLATION 10FT LAP (TUBING) ×3 IMPLANT

## 2020-11-30 NOTE — Discharge Instructions (Signed)

## 2020-11-30 NOTE — Interval H&P Note (Signed)
History and Physical Interval Note:  11/30/2020 7:15 AM  Brent Cole  has presented today for surgery, with the diagnosis of morbid obesity.  The various methods of treatment have been discussed with the patient and family. After consideration of risks, benefits and other options for treatment, the patient has consented to  Procedure(s): XI ROBOTIC ASSISTED GASTRECTOMY (N/A) UPPER GI ENDOSCOPY (N/A) as a surgical intervention.  The patient's history has been reviewed, patient examined, no change in status, stable for surgery.  I have reviewed the patient's chart and labs.  Questions were answered to the patient's satisfaction.     [name]

## 2020-11-30 NOTE — Op Note (Signed)
11/30/2020 Brent Cole 05-13-1975 [id number]   PRE-OPERATIVE DIAGNOSIS:  Severe obesity BMI 71  OSA on CPAP  Essential hypertension  Prediabetes  Elevated LDL cholesterol level  Arthritis of knee  POST-OPERATIVE DIAGNOSIS:  same  PROCEDURE:  Procedure(s): Xi robotic SLEEVE GASTRECTOMY  UPPER GI ENDOSCOPY  SURGEON:  Surgeon(s): Atilano Ina, MD FACS FASMBS  ASSISTANTS: Ivar Drape MD   ANESTHESIA:   general  DRAINS: none   BOUGIE: 40 fr ViSiGi  LOCAL MEDICATIONS USED:   Exparel  EBL: <25 ml  SPECIMEN:  Source of Specimen:  Greater curvature of stomach  DISPOSITION OF SPECIMEN:  PATHOLOGY  COUNTS:  YES  INDICATION FOR PROCEDURE: This is a very pleasant 45 y.o.-year-old severely obese male who has had unsuccessful attempts for sustained weight loss. The patient presents today for a planned laparoscopic/robotic sleeve gastrectomy with upper endoscopy. We have discussed the risk and benefits of the procedure extensively preoperatively. Please see my separate notes.  PROCEDURE: After obtaining informed consent and receiving 5000 units of subcutaneous heparin, the patient was brought to the operating room at [facility] and placed supine on the operating room table. General endotracheal anesthesia was established. Sequential compression devices were placed. A orogastric tube was placed. The patient's abdomen was prepped and draped in the usual standard surgical fashion. The patient received preoperative IV antibiotic. A surgical timeout was performed. ERAS protocol used.   Access to the abdomen was achieved using a 5 mm 0 laparoscope thru a 5 mm trocar In the mid clavicular line in the right midabdomen about 10 cm to the right & slight above the level of the umbilicus using the Optiview technique. Pneumoperitoneum was smoothly established up to 15 mm of mercury. The laparoscope was advanced and the abdominal cavity was surveilled and no evidence of injury  to surrounding structures. The patient was then placed in reverse Trendelenburg. The [facility] liver retractor was placed under the left lobe of the liver through a 5 mm trocar incision site in the subxiphoid position. I then determined the robotic camera position.  A tap block was performed on patient's left side under direct visualization for postoperative pain relief.  A 8 mm robotic trocar was placed about 16cm from the xiphoid just slightly to the left of the umbilicus.  2 additional robotic 8 mm trochars were placed on patient's left side of the abdomen all under direct visualization about 10 cm apart.  A final 12 mm robotic trocar was placed in the right midabdomen under direct visualization as well..    The XI robot was then docked and targeted for upper abdominal anatomy.  Arm 2  was attached to the left paraumbilical abdominal trocar and the camera was inserted.  Anatomy was targeted.  Arms 1 and ,3,4 were then attached to the robotic trochars.  A fenestrated bipolar was placed in arm 1.  A vessel sealer was placed in arm 3, tip up grasper in arm 4.  My assistant stayed at the bedside while I went to the robotic console.  The stomach was inspected. It was completely decompressed and the orogastric tube was removed.  There was no anterior dimple that was obviously visible.  Patient had a large amount of intra-abdominal adipose tissue.  His preoperative upper GI showed no hiatal hernia    We identified the pylorus and measured 6 cm proximal to the pylorus and identified an area of where we would start taking down the short gastric vessels.  The vessel sealer was used to  take down the short gastric vessels along the greater curvature of the stomach. We were able to enter the lesser sac. We continued to march along the greater curvature of the stomach taking down the short gastrics.  My assistant provided traction laterally to patient's right side.  as we approached the gastrosplenic ligament we took  care in this area not to injure the spleen. We were able to take down the entire gastrosplenic ligament. We then mobilized the fundus away from the left crus of diaphragm. There were not any significant posterior gastric avascular attachments. This left the stomach completely mobilized. No vessels had been taken down along the lesser curvature of the stomach. There was no posterior plug of fat at the hiatus.    We then reidentified the pylorus. A 40Fr ViSiGi was then placed in the oropharynx and advanced down into the stomach and placed in the distal antrum and positioned along the lesser curvature. It was placed under suction which secured the 40Fr ViSiGi in place along the lesser curve. Then using the robotic 60 mm stapler with a green load, I placed a stapler along the antrum approximately 6cm from the pylorus. The stapler was angled so that there is ample room at the angularis incisura. I then fired the first staple load after inspecting it posteriorly to ensure adequate space both anteriorly and posteriorly.  I then used a  60 mm blue load staple cartridge. The stapler did not have to pause for compression so at this point I started using white loads. The robotic stapler was then repositioned with a 60 mm white load  and we continued to march up along the ViSiGi. My assistant was exchanging the stapler after firing. The tip up grasper was holding traction along the greater curvature stomach along the cauterized short gastric vessels ensuring that the stomach was symmetrically retracted. Prior to each firing of the staple, we rotated the stomach to ensure that there is adequate stomach left.  As we approached the fundus, I continued using 60 mm white cartridge aiming  lateral to the GE junction after mobilizing some of the esophageal fat pad.  The sleeve was inspected. There is no evidence of cork screw. The staple line appeared hemostatic  The CRNA inflated the ViSiGi to the green zone and the upper abdomen  was flooded with saline. There were no bubbles. The sleeve was decompressed and the ViSiGi removed.I performed an upper endoscopy.  The endoscope was placed in the patient's oropharynx and gently glided down.  Z-line at approximately 41 cm and it was regular.  The endoscope was advanced into the gastric sleeve and insufflated.  I was able to easily advance the endoscope down into the antrum.  Pylorus was visible.  There is no significant stricture at the incisura.  There was no corkscrewing.  The external staple line was re-inspected and there was no bleeding. There is no evidence of internal bleeding.  The sleeve was decompressed and the endoscope was removed.   The greater curvature the stomach was grasped with a laparoscopic grasper.  At this point I scrubbed back in.  The robot was undocked.  We removed the specimen from the 12 mm trocar site.  The liver retractor was removed. we then closed the 12 mm trocar site with 1 interrupted 0 Vicryl sutures through the fascia using the endoclose. The closure was viewed laparoscopically and it was airtight.  A laparoscopic tap block was placed on the patient's right side of the abdomen for postoperative  pain relief.  Remaining Exparel was then infiltrated in the preperitoneal spaces around the trocar sites. Pneumoperitoneum was released. All trocar sites were closed with a 4-0 Monocryl in a subcuticular fashion followed by the application of steri-strips, gauze, & tegaderms. The patient was extubated and taken to the recovery room in stable condition. All needle, instrument, and sponge counts were correct x2. There are no immediate complications  (1) 60 mm green (1) 60 mm blue (5) 60 mm white  PLAN OF CARE: Admit to inpatient   PATIENT DISPOSITION:  PACU - hemodynamically stable.   Delay start of Pharmacological VTE agent (>24hrs) due to surgical blood loss or risk of bleeding:  no   Mary Sella. Andrey Campanile, MD, FACS FASMBS General, Bariatric, & Minimally Invasive  Surgery [facility], [place]

## 2020-11-30 NOTE — Anesthesia Procedure Notes (Addendum)
Procedure Name: Intubation Date/Time: 11/30/2020 7:34 AM Performed by: Gerald Leitz, CRNA Pre-anesthesia Checklist: Patient identified, Patient being monitored, Timeout performed, Emergency Drugs available and Suction available Patient Re-evaluated:Patient Re-evaluated prior to induction Oxygen Delivery Method: Circle system utilized Preoxygenation: Pre-oxygenation with 100% oxygen Induction Type: Combination inhalational/ intravenous induction Ventilation: Oral airway inserted - appropriate to patient size and Two handed mask ventilation required Laryngoscope Size: Mac, 3, 4 and Glidescope Grade View: Grade I Tube type: Oral Tube size: 8.0 mm Number of attempts: 1 Airway Equipment and Method: Stylet and Video-laryngoscopy Placement Confirmation: ETT inserted through vocal cords under direct vision, positive ETCO2 and breath sounds checked- equal and bilateral Secured at: 24 cm Tube secured with: Tape Dental Injury: Teeth and Oropharynx as per pre-operative assessment  Difficulty Due To: Difficulty was anticipated, Difficult Airway- due to large tongue and Difficult Airway- due to limited oral opening Future Recommendations: Recommend- induction with short-acting agent, and alternative techniques readily available

## 2020-11-30 NOTE — Transfer of Care (Signed)
Immediate Anesthesia Transfer of Care Note  Patient: Brent Cole  Procedure(s) Performed: Procedure(s): XI ROBOTIC ASSISTED GASTRECTOMY (N/A) UPPER GI ENDOSCOPY (N/A)  Patient Location: PACU  Anesthesia Type:General  Level of Consciousness: Alert, Awake, Oriented  Airway & Oxygen Therapy: Patient Spontanous Breathing  Post-op Assessment: Report given to RN  Post vital signs: Reviewed and stable  Last Vitals:  Vitals:   11/30/20 0540  BP: (!) 176/93  Pulse: 94  Resp: 19  Temp: 36.8 C  SpO2: 95%    Complications: No apparent anesthesia complications

## 2020-11-30 NOTE — Progress Notes (Signed)

## 2020-11-30 NOTE — Progress Notes (Signed)
PHARMACY CONSULT FOR:  Risk Assessment for Post-Discharge VTE Following Bariatric Surgery  Post-Discharge VTE Risk Assessment: This patient's probability of 30-day post-discharge VTE is increased due to the factors marked:  X Male    Age >/=60 years   X BMI >/=50 kg/m2    CHF    Dyspnea at Rest    Paraplegia   X Non-gastric-band surgery    Operation Time >/=3 hr    Return to OR     Length of Stay >/= 3 d   Hx of VTE   Hypercoagulable condition   Significant venous stasis    Predicted probability of 30-day post-discharge VTE: 0.51% (moderate)  Other patient-specific factors to consider: N/A  Recommendation for Discharge: Enoxaparin 60 mg [place] q12h x 2 weeks post-discharge  Brent Cole is a 45 y.o. male who underwent robotic assisted gastrectomy on 11/30/20   Case start: 0759 Case end: 0922  No Known Allergies  Patient Measurements: Height: 6' (182.9 cm) Weight: (!) 236.5 kg (521 lb 5 oz) IBW/kg (Calculated) : 77.6 Body mass index is 70.7 kg/m.  No results for input(s): WBC, HGB, HCT, PLT, APTT, CREATININE, LABCREA, CREATININE, CREAT24HRUR, MG, PHOS, ALBUMIN, PROT, ALBUMIN, AST, ALT, ALKPHOS, BILITOT, BILIDIR, IBILI in the last 72 hours. Estimated Creatinine Clearance: 164.6 mL/min (by C-G formula based on SCr of 1.12 mg/dL).    Past Medical History:  Diagnosis Date   ACL tear    left knee - no surgery - physical therapy   Arthritis    left knee   History of pneumonia as a child    Hyperlipidemia    Hypertension    Sleep apnea    Sleep apnnea currently not treated, pt. reports that he had CPAP at one time but he knows he needs to be tested  again     Medications Prior to Admission  Medication Sig Dispense Refill Last Dose   amLODipine (NORVASC) 10 MG tablet Take 10 mg by mouth daily.   11/29/2020 at am   losartan-hydrochlorothiazide (HYZAAR) 100-25 MG tablet Take 1 tablet by mouth daily.   11/29/2020 at am    Rexford Maus, PharmD 11/30/2020 10:02  AM

## 2020-11-30 NOTE — Anesthesia Postprocedure Evaluation (Signed)
Anesthesia Post Note  Patient: Brent Cole  Procedure(s) Performed: XI ROBOTIC ASSISTED GASTRECTOMY (Abdomen) UPPER GI ENDOSCOPY     Patient location during evaluation: PACU Anesthesia Type: General Level of consciousness: awake and alert, oriented and patient cooperative Pain management: pain level controlled Vital Signs Assessment: post-procedure vital signs reviewed and stable Respiratory status: spontaneous breathing, nonlabored ventilation and respiratory function stable Cardiovascular status: blood pressure returned to baseline and stable Postop Assessment: no apparent nausea or vomiting Anesthetic complications: no   No notable events documented.  Last Vitals:  Vitals:   11/30/20 1030 11/30/20 1047  BP: (!) 168/82 (!) 175/95  Pulse: 84 85  Resp: 16 18  Temp:  (!) 36.4 C  SpO2: 100% 99%    Last Pain:  Vitals:   11/30/20 1047  TempSrc: Oral  PainSc:                  [name]

## 2020-11-30 NOTE — H&P (Signed)
PROVIDER: Kynsleigh Westendorf Leanne Chang, MD  MRN: [id number] DOB: 04-14-1975 DATE OF ENCOUNTER: 11/13/2020 Subjective   Chief Complaint: here for follow up   History of Present Illness: Brent Cole is a 45 y.o. male who is seen today for follow-up regarding his severe obesity and related comorbidities. I initially met him in August 2021. His weight at that time was 544 pounds with a BMI of 76.  His comorbidities include hypertension, dyslipidemia, obstructive sleep apnea, chronic low back pain with some left radiculopathy, prediabetes.  He denied any heartburn, reflux or indigestion. No prior abdominal surgery.  Labs from July 2021 show a normal comprehensive metabolic panel, globin H4V 6.5, total cholesterol 111, triglycerides 84, HDL 35. Some additional labs from March 2022 showed a creatinine of 1.18 blood glucose of 120; total cholesterol 194, triglycerides 82, HDL 42, LDL 137  He underwent nutritional and psychological evaluation and was felt to be a candidate for bariatric surgery  Upper GI and chest x-ray from December 2021 were unremarkable.  He currently denies any chest pain, chest pressure, shortness of breath, dyspnea on exertion. No heartburn, reflux or indigestion. Intermittently using his CPAP. He states he is a night owl and will fall asleep while watching TV and will not have it on. Review of Systems: A complete review of systems was obtained from the patient. I have reviewed this information and discussed as appropriate with the patient. See HPI as well for other ROS.  ROS   Medical History: Past Medical History:  Diagnosis Date   Arthritis   Hyperlipidemia   Hypertension   Sleep apnea   There is no problem list on file for this patient.  No past surgical history on file.   No Known Allergies  Current Outpatient Medications on File Prior to Visit  Medication Sig Dispense Refill   amLODIPine (NORVASC) 10 MG tablet Take 10 mg by mouth once daily    losartan-hydrochlorothiazide (HYZAAR) 100-25 mg tablet Take 1 tablet by mouth once daily   No current facility-administered medications on file prior to visit.   Family History  Problem Relation Age of Onset   High blood pressure (Hypertension) Mother   Coronary Artery Disease (Blocked arteries around heart) Mother    Social History   Tobacco Use  Smoking Status Never Smoker  Smokeless Tobacco Never Used    Social History   Socioeconomic History   Marital status: Single  Tobacco Use   Smoking status: Never Smoker   Smokeless tobacco: Never Used  Scientific laboratory technician Use: Never used  Substance and Sexual Activity   Alcohol use: Yes   Drug use: Defer   Sexual activity: Defer   Objective:   Vitals:  11/13/20 0848  BP: 136/82  Pulse: (!) 118  Temp: 36.6 C (97.9 F)  SpO2: 93%  Weight: (!) 245.1 kg (540 lb 6.4 oz)  Height: 182.9 cm (6')   Body mass index is 73.29 kg/m.  Gen: alert, NAD, non-toxic appearing, severe obesity Pupils: equal, no scleral icterus Pulm: Lungs clear to auscultation, symmetric chest rise CV: regular rate and rhythm Abd: soft, nontender, nondistended. No cellulitis. No incisional hernia Ext: no edema,  Skin: no rash, no jaundice  Labs, Imaging and Diagnostic Testing:  Reviewed his pulmonary note from May where they discussed his obstructive sleep apnea and compliance with CPAP which was around 73%.  Cardiology assessment from September 2021: -The Revised Cardiac Risk Index = 0 which equates to 0.4% (very low risk) estimated risk of perioperative myocardial  infarction, pulmonary edema, ventricular fibrillation, cardiac arrest, or complete heart block.  -His EKG is normal. His cardiovascular exam is without murmurs rubs or gallops. He does have lymphedema in the left lower extremity which is obesity related. He has no evidence of heart failure on exam. -He is high risk for bariatric surgery given his super morbid obesity. His BMI is 75. His  weight is 555 pounds. His weight puts him at considerable pulmonary risk. -He is able to climb 1 flight of stairs without any chest pain or shortness of breath. I think he will do fine from a cardiovascular standpoint however I do worry about him from pulmonary standpoint regarding his weight. He also needs a sleep apnea evaluated. -Regarding his future surgery I would recommend no further cardiac testing. His weight does preclude accurate assessment on most stress testing. I viewed his surgery is urgent given his weight of 555 pounds I would not recommend further testing. -He should continue his current medications. I think weight loss is indicated as this will improve his diabetes as well as his high blood pressure. Our team is available in the perioperative period if needed. Assessment and Plan:  Diagnoses and all orders for this visit:  Severe obesity (CMS-HCC)  OSA on CPAP  Essential hypertension  Prediabetes  Elevated LDL cholesterol level  Arthritis of knee    We reviewed his work-up to date. We reviewed his imaging studies. We reviewed his cardiology assessment.  He was originally interested in Roux-en-Y gastric bypass but because of his super elevated BMI and cardiology assessment I felt the sleeve gastrectomy would be immediately safer for him than a Roux-en-Y gastric bypass. Moreover I believe a sleeve gastrectomy would be technically safer than a gastric bypass. Nonetheless I did discuss that he is at increased risk for perioperative complications because of his BMI. He is at increased risk for postoperative DVT prophylaxis and will go out on extended DVT prophylaxis for 1 month. We reviewed the typical hospitalization. We reviewed the typical issues that we see in the first few weeks after surgery. He has attended his preoperative education class. We discussed the importance of the preoperative meal plan. All of his questions were asked and answered.

## 2020-12-01 ENCOUNTER — Encounter (HOSPITAL_COMMUNITY): Payer: Self-pay | Admitting: General Surgery

## 2020-12-01 ENCOUNTER — Other Ambulatory Visit (HOSPITAL_COMMUNITY): Payer: Self-pay

## 2020-12-01 LAB — CBC WITH DIFFERENTIAL/PLATELET
Abs Immature Granulocytes: 0.01 10*3/uL (ref 0.00–0.07)
Basophils Absolute: 0 10*3/uL (ref 0.0–0.1)
Basophils Relative: 0 %
Eosinophils Absolute: 0 10*3/uL (ref 0.0–0.5)
Eosinophils Relative: 0 %
HCT: 40 % (ref 39.0–52.0)
Hemoglobin: 12.5 g/dL — ABNORMAL LOW (ref 13.0–17.0)
Immature Granulocytes: 0 %
Lymphocytes Relative: 14 %
Lymphs Abs: 0.8 10*3/uL (ref 0.7–4.0)
MCH: 24.6 pg — ABNORMAL LOW (ref 26.0–34.0)
MCHC: 31.3 g/dL (ref 30.0–36.0)
MCV: 78.6 fL — ABNORMAL LOW (ref 80.0–100.0)
Monocytes Absolute: 0.4 10*3/uL (ref 0.1–1.0)
Monocytes Relative: 6 %
Neutro Abs: 4.8 10*3/uL (ref 1.7–7.7)
Neutrophils Relative %: 80 %
Platelets: 235 10*3/uL (ref 150–400)
RBC: 5.09 MIL/uL (ref 4.22–5.81)
RDW: 15.7 % — ABNORMAL HIGH (ref 11.5–15.5)
WBC: 6.1 10*3/uL (ref 4.0–10.5)
nRBC: 0 % (ref 0.0–0.2)

## 2020-12-01 LAB — COMPREHENSIVE METABOLIC PANEL
ALT: 39 U/L (ref 0–44)
AST: 29 U/L (ref 15–41)
Albumin: 3.5 g/dL (ref 3.5–5.0)
Alkaline Phosphatase: 44 U/L (ref 38–126)
Anion gap: 6 (ref 5–15)
BUN: 19 mg/dL (ref 6–20)
CO2: 28 mmol/L (ref 22–32)
Calcium: 8.6 mg/dL — ABNORMAL LOW (ref 8.9–10.3)
Chloride: 102 mmol/L (ref 98–111)
Creatinine, Ser: 1.09 mg/dL (ref 0.61–1.24)
GFR, Estimated: >60 mL/min (ref >60)
Glucose, Bld: 119 mg/dL — ABNORMAL HIGH (ref 70–99)
Potassium: 4.1 mmol/L (ref 3.5–5.1)
Sodium: 136 mmol/L (ref 135–145)
Total Bilirubin: 0.7 mg/dL (ref 0.3–1.2)
Total Protein: 7.7 g/dL (ref 6.5–8.1)

## 2020-12-01 LAB — GLUCOSE, CAPILLARY
Glucose-Capillary: 113 mg/dL — ABNORMAL HIGH (ref 70–99)
Glucose-Capillary: 121 mg/dL — ABNORMAL HIGH (ref 70–99)

## 2020-12-01 LAB — SURGICAL PATHOLOGY

## 2020-12-01 MED ORDER — PANTOPRAZOLE SODIUM 40 MG PO TBEC
40.0000 mg | DELAYED_RELEASE_TABLET | Freq: Every day | ORAL | 0 refills | Status: AC
  Filled 2020-12-01: qty 30, 30d supply, fill #0

## 2020-12-01 MED ORDER — GABAPENTIN 100 MG PO CAPS
200.0000 mg | ORAL_CAPSULE | Freq: Two times a day (BID) | ORAL | 0 refills | Status: AC
  Filled 2020-12-01: qty 20, 5d supply, fill #0

## 2020-12-01 MED ORDER — ENOXAPARIN SODIUM 60 MG/0.6ML IJ SOSY
60.0000 mg | PREFILLED_SYRINGE | Freq: Two times a day (BID) | INTRAMUSCULAR | 0 refills | Status: AC
  Filled 2020-12-01 – 2020-12-16 (×2): qty 18, 15d supply, fill #0

## 2020-12-01 MED ORDER — ONDANSETRON 4 MG PO TBDP
4.0000 mg | ORAL_TABLET | Freq: Four times a day (QID) | ORAL | 0 refills | Status: AC | PRN
  Filled 2020-12-01: qty 20, 5d supply, fill #0

## 2020-12-01 MED ORDER — ACETAMINOPHEN 500 MG PO TABS: 1000.0000 mg | ORAL_TABLET | Freq: Three times a day (TID) | ORAL | 0 refills | Status: AC

## 2020-12-01 NOTE — Progress Notes (Signed)
Patient alert and oriented, Post op day 1.  Provided support and encouragement.  Encouraged pulmonary toilet, ambulation and small sips of liquids.  All questions answered.  Will continue to monitor. 

## 2020-12-01 NOTE — Progress Notes (Signed)
Patient alert and oriented, pain is controlled. Patient is tolerating fluids, advanced to protein shake today, patient is tolerating well.  Reviewed Gastric sleeve discharge instructions with patient and patient is able to articulate understanding.  Provided information on BELT program, Support Group and [facility] outpatient pharmacy. Reviewed Lovenox teaching kit and pt reviewed Lovenox "Patient-Self Injection Video".  All questions answered, will continue to monitor.  

## 2020-12-01 NOTE — Progress Notes (Addendum)
Pt seen x2 and offered assistance with nocturnal cpap, but pt was not ready to sleep yet.  Pt was encouraged to let his nurse know when he is ready for cpap.  Rn aware and will notify this Clinical research associate.

## 2020-12-01 NOTE — TOC Progression Note (Signed)
Transition of Care [facility]) - Progression Note    Patient Details  Name: Brent Cole MRN: [id number] Date of Birth: 12-30-1975  Transition of [facility]) CM/SW Contact  Geni Bers, RN Phone Number: 12/01/2020, 11:51 AM  Clinical Narrative:    At present time there are no HH needs.    Expected Discharge Plan: Home/Self Care Barriers to Discharge: No Barriers Identified  Expected Discharge Plan and Services Expected Discharge Plan: Home/Self Care       Living arrangements for the past 2 months: Single Family Home                                       Social Determinants of Health (SDOH) Interventions    Readmission Risk Interventions No flowsheet data found.

## 2020-12-01 NOTE — Discharge Summary (Signed)
Physician Discharge Summary  Brent Cole APO:[id number] DOB: Sep 13, 1975 DOA: 11/30/2020  PCP: Seward Carol, MD  Admit date: 11/30/2020 Discharge date: 12/01/2020  Recommendations for Outpatient Follow-up:    Follow-up Information     [name], MD. Go on 12/23/2020.   Specialty: General Surgery Why: at 11:15am.  Please arrive 15 minutes prior to your appointment time.  Thankyou. Contact information: [address] [place] [address] [phone]         [name], PA-C. Go on 01/26/2021.   Specialty: General Surgery Why: at 2:15pm for Dr. Redmond Pulling.  Please arrive 15 minutes prior to your appointment time.  Thank you. Contact information: [place] [address] [phone]                Discharge Diagnoses:  Active Problems:   S/P robotic sleeve gastrectomy Severe obesity BMI 71   OSA on CPAP  Essential hypertension  Prediabetes  Elevated LDL cholesterol level  Arthritis of knee  Surgical Procedure: robotic Sleeve Gastrectomy, upper endoscopy  Discharge Condition: Good Disposition: Home  Diet recommendation: Postoperative sleeve gastrectomy diet (liquids only)  Filed Weights   11/30/20 0531  Weight: (!) 236.5 kg     Hospital Course:  The patient was admitted for a planned robotic sleeve gastrectomy. Please see operative note. Preoperatively the patient was given 5000 units of subcutaneous heparin for DVT prophylaxis. Postoperative prophylactic Lovenox dosing was started on the evening of postoperative day 0. ERAS protocol was used. On the evening of postoperative day 0, the patient was started on water and ice chips. On postoperative day 1 the patient had no fever or tachycardia and was tolerating water in their diet was gradually advanced throughout the day. The patient was ambulating without difficulty. Their vital signs are stable without fever or tachycardia. Their hemoglobin had remained stable. The  patient declined CPAP therapy. The patient had received discharge instructions and counseling. They were deemed stable for discharge and had met discharge criteria  Because of his risk factors, he was determined to be at increased risk for post discharge vte so he was educated and discharged on extended vte prophylaxis for 1 month  BP (!) 188/100 (BP Location: Right Arm)   Pulse 69   Temp 97.7 F (36.5 C) (Oral)   Resp 16   Ht 6' (1.829 m)   Wt (!) 236.5 kg   SpO2 98%   BMI 70.70 kg/m   Gen: alert, NAD, non-toxic appearing Pupils: equal, no scleral icterus Pulm: Lungs clear to auscultation, symmetric chest rise CV: regular rate and rhythm Abd: soft, mild approp ender, nondistended. No cellulitis. No incisional hernia Ext: no edema, no calf tenderness Skin: no rash, no jaundice   Discharge Instructions  Discharge Instructions     Ambulate hourly while awake   Complete by: As directed    Call MD for:  difficulty breathing, headache or visual disturbances   Complete by: As directed    Call MD for:  persistant dizziness or light-headedness   Complete by: As directed    Call MD for:  persistant nausea and vomiting   Complete by: As directed    Call MD for:  redness, tenderness, or signs of infection (pain, swelling, redness, odor or green/yellow discharge around incision site)   Complete by: As directed    Call MD for:  severe uncontrolled pain   Complete by: As directed    Call MD for:  temperature >101 F   Complete  by: As directed    Diet bariatric full liquid   Complete by: As directed    Discharge instructions   Complete by: As directed    See bariatric discharge instructions   Incentive spirometry   Complete by: As directed    Perform hourly while awake      Allergies as of 12/01/2020   No Known Allergies      Medication List     TAKE these medications    acetaminophen 500 MG tablet Commonly known as: TYLENOL Take 2 tablets (1,000 mg total) by mouth  every 8 (eight) hours for 5 days.   amLODipine 10 MG tablet Commonly known as: NORVASC Take 10 mg by mouth daily. Notes to patient: Monitor Blood Pressure Daily and keep a log for primary care physician.  You may need to make changes to your medications with rapid weight loss.     enoxaparin 60 MG/0.6ML injection Commonly known as: LOVENOX Inject 0.6 mLs (60 mg total) into the skin every 12 (twelve) hours.   gabapentin 100 MG capsule Commonly known as: NEURONTIN Take 2 capsules (200 mg total) by mouth every 12 (twelve) hours.   losartan-hydrochlorothiazide 100-25 MG tablet Commonly known as: HYZAAR Take 1 tablet by mouth daily. Notes to patient: Monitor Blood Pressure Daily and keep a log for primary care physician.  You may need to make changes to your medications with rapid weight loss.     ondansetron 4 MG disintegrating tablet Commonly known as: ZOFRAN-ODT Take 1 tablet (4 mg total) by mouth every 6 (six) hours as needed for nausea or vomiting.   pantoprazole 40 MG tablet Commonly known as: PROTONIX Take 1 tablet (40 mg total) by mouth daily.        Follow-up Information     [name], MD. Go on 12/23/2020.   Specialty: General Surgery Why: at 11:15am.  Please arrive 15 minutes prior to your appointment time.  Thankyou. Contact information: [address] [name] [address] [phone]         [name], PA-C. Go on 01/26/2021.   Specialty: General Surgery Why: at 2:15pm for Dr. Redmond Pulling.  Please arrive 15 minutes prior to your appointment time.  Thank you. Contact information: [address] [name] [address] [phone]                  The results of significant diagnostics from this hospitalization (including imaging, microbiology, ancillary and laboratory) are listed below for reference.    Significant Diagnostic Studies: No results found.  Labs: Basic Metabolic Panel: Recent Labs  Lab 12/01/20 0431  NA  136  K 4.1  CL 102  CO2 28  GLUCOSE 119*  BUN 19  CREATININE 1.09  CALCIUM 8.6*   Liver Function Tests: Recent Labs  Lab 12/01/20 0431  AST 29  ALT 39  ALKPHOS 44  BILITOT 0.7  PROT 7.7  ALBUMIN 3.5    CBC: Recent Labs  Lab 11/30/20 1031 12/01/20 0431  WBC  --  6.1  NEUTROABS  --  4.8  HGB 13.6 12.5*  HCT 45.6 40.0  MCV  --  78.6*  PLT  --  235    CBG: Recent Labs  Lab 11/30/20 1620 11/30/20 2012 11/30/20 2354 12/01/20 0334 12/01/20 0748  GLUCAP 149* 126* 130* 113* 121*    Active Problems:   S/P laparoscopic sleeve gastrectomy   Time coordinating discharge: 25 min  Signed:  Gayland Curry, MD [facility]  [facility], [place] 12/01/2020, 3:19 PM

## 2020-12-01 NOTE — Progress Notes (Signed)
Patient did not ambulate within 4 hours post op, due to being too lethargic

## 2020-12-01 NOTE — Progress Notes (Signed)
Discharge instructions discussed with patient and family, verbalized agreement and understanding 

## 2020-12-02 ENCOUNTER — Other Ambulatory Visit (HOSPITAL_COMMUNITY): Payer: Self-pay

## 2020-12-02 NOTE — Progress Notes (Signed)
24hr fluid recall prior to discharge: 540mL. Per dehydration protocol, will call pt to f/u within one week post op. 

## 2020-12-04 ENCOUNTER — Telehealth (HOSPITAL_COMMUNITY): Payer: Self-pay | Admitting: *Deleted

## 2020-12-04 NOTE — Telephone Encounter (Signed)
1.  Tell me about your pain and pain management? Pt denies any pain.  2.  Let's talk about fluid intake.  How much total fluid are you taking in? Pt states that he is getting in at least 64oz of fluid including protein shakes, bottled water, Gatorade and cream soups.  3.  How much protein have you taken in the last 2 days? Pt states he is very close to meeting his goal of 80g of protein each day with the protein shakes and added protein powder.  He stated that he got in at least 75g of protein yesterday. Pt instructed to prioritize protein to ensure that he is meeting the goal daily.  4.  Have you had nausea?  Tell me about when have experienced nausea and what you did to help? Pt denies nausea.   5.  Has the frequency or color changed with your urine? Pt states that he is urinating "fine" with no changes in frequency or urgency.     6.  Tell me what your incisions look like? "Incisions look okay". Pt denies a fever, chills.  Pt states incisions are not swollen or open.  Pt states that when he got home, he noticed that one of his incision "oozed" some from getting in and out of the car, but has stopped.  Pt encouraged to call [facility] if incisions change.   7.  Have you been passing gas? BM? Pt states that he is having BMs. Last BM 12/03/20.     8.  If a problem or question were to arise who would you call?  Do you know contact numbers for [facility], [facility], and [facility]? Pt denies dehydration symptoms.  Pt can describe s/sx of dehydration.  Pt knows to call [facility] for surgical, [facility] for nutrition, and [facility] for non-urgent questions or concerns.   9.  How has the walking going? Pt states she is walking around and able to be active without difficulty.   10. Are you still using your incentive spirometer?  If so, how often? Pt states that he is using his I.S. frequently. Pt encouraged to use incentive spirometer, at least 10x every hour while awake until she sees the surgeon.  11.  How are your vitamins and  calcium going?  How are you taking them? Pt states that she is taking his supplements and vitamins without difficulty.  Pt reminded to take supplements at least two hours apart.  Pt states that he is taking the Lovenox injections without any difficulty at 0800 and 2000 daily.  Reminded patient that the first 30 days post-operatively are important for successful recovery.  Practice good hand hygiene, wearing a mask when appropriate (since optional in most places), and minimizing exposure to people who live outside of the home, especially if they are exhibiting any respiratory, GI, or illness-like symptoms.

## 2020-12-14 ENCOUNTER — Other Ambulatory Visit (HOSPITAL_COMMUNITY): Payer: Self-pay

## 2020-12-15 ENCOUNTER — Other Ambulatory Visit: Payer: Self-pay

## 2020-12-15 ENCOUNTER — Encounter: Payer: 59 | Attending: General Surgery | Admitting: Skilled Nursing Facility1

## 2020-12-15 DIAGNOSIS — E669 Obesity, unspecified: Secondary | ICD-10-CM | POA: Diagnosis present

## 2020-12-16 ENCOUNTER — Other Ambulatory Visit (HOSPITAL_COMMUNITY): Payer: Self-pay

## 2020-12-17 ENCOUNTER — Other Ambulatory Visit (HOSPITAL_COMMUNITY): Payer: Self-pay

## 2020-12-17 NOTE — Progress Notes (Signed)
2 Week Post-Operative Nutrition Class   Patient was seen on 12/15/2020 for Post-Operative Nutrition education at [facility]   Surgery date: 11/30/20 Surgery type: sleeve Start weight at [facility]: 550 pounds Weight today: pt arrived too late to be weighed Bowel Habits: Every day to every other day no complaints   Body Composition Scale Date  Current Body Weight 495.8  Total Body Fat % 47.1  Visceral Fat 58  Fat-Free Mass % 52.8   Total Body Water % 33.8  Muscle-Mass lbs 93.8  BMI 67.6  Body Fat Displacement          Torso  lbs 145.5         Left Leg  lbs 29.0         Right Leg  lbs 29.0         Left Arm  lbs 14.5         Right Arm   lbs 14.5      The following the learning objectives were met by the patient during this course: Identifies Phase 3 (Soft, High Proteins) Dietary Goals and will begin from 2 weeks post-operatively to 2 months post-operatively Identifies appropriate sources of fluids and proteins  Identifies appropriate fat sources and healthy verses unhealthy fat types   States protein recommendations and appropriate sources post-operatively Identifies the need for appropriate texture modifications, mastication, and bite sizes when consuming solids Identifies appropriate fat consumption and sources Identifies appropriate multivitamin and calcium sources post-operatively Describes the need for physical activity post-operatively and will follow MD recommendations States when to call healthcare provider regarding medication questions or post-operative complications   Handouts given during class include: Phase 3A: Soft, High Protein Diet Handout Phase 3 High Protein Meals Healthy Fats   Follow-Up Plan: Patient will follow-up at [facility] in 6 weeks for 2 month post-op nutrition visit for diet advancement per MD.

## 2020-12-21 ENCOUNTER — Telehealth: Payer: Self-pay | Admitting: Skilled Nursing Facility1

## 2020-12-21 NOTE — Telephone Encounter (Signed)
RD called pt to verify fluid intake once starting soft, solid proteins 2 week post-bariatric surgery.  ° °Daily Fluid intake: 64 oz °Daily Protein intake: 80 g °Bowel Habits: every other day to every day ° °Concerns/issues:  ° ° °None stated °

## 2020-12-22 ENCOUNTER — Telehealth: Payer: Self-pay | Admitting: Skilled Nursing Facility1

## 2020-12-22 NOTE — Telephone Encounter (Signed)
The cheese ones did not load but I think those are like just toasted cheese so that should be fine.  It is a bit too soon for the chic pea pasta but maybe in a few weeks it can be discussed again. Your stomach is not ready for that type of starch and the way it is processed it will more than likely make you feel overly full and bloated.   From: Brent [email or web address]>  Sent: Tuesday, December 22, 2020 12:24 AM To: [name]> Subject: Feedback on Food Selection  *Caution - External email - see footer for warnings* Hi Brent Cole,  I have been working on a shopping list while balancing my new dietary normal and Thanksgiving with family and friends. Could you please provide some feedback on the items listed below:  We discussed plant-based meat options. This caused me to think about Chickpea pasta, which has at least 12g of protein. What are your thoughts?       [email or web address]  Also, what do you think about cheese crisps for a snack? Actually, I saw these and thought that I could have a few of these to substitute crackers with chicken salad.      [email or web address]  Thanks for the feedback!  --   Brent Cole "[employer]." [phone] mobile

## 2021-01-25 ENCOUNTER — Other Ambulatory Visit: Payer: Self-pay

## 2021-01-25 ENCOUNTER — Encounter: Payer: 59 | Attending: General Surgery | Admitting: Skilled Nursing Facility1

## 2021-01-25 DIAGNOSIS — E669 Obesity, unspecified: Secondary | ICD-10-CM | POA: Diagnosis not present

## 2021-01-25 NOTE — Progress Notes (Signed)
Bariatric Nutrition Follow-Up Visit Medical Nutrition Therapy   NUTRITION ASSESSMENT    Surgery date: 11/30/20 Surgery type: sleeve Start weight at [facility]: 550 pounds Weight today: 480.1 pounds   Body Composition Scale Date 01/25/2021  Current Body Weight 495.8 480.1  Total Body Fat % 47.1 46.5  Visceral Fat 58 56  Fat-Free Mass % 52.8 53.4   Total Body Water % 33.8 34.4  Muscle-Mass lbs 93.8 90.8  BMI 67.6 65.4  Body Fat Displacement           Torso  lbs 145.5 139         Left Leg  lbs 29.0 27.8         Right Leg  lbs 29.0 27.8         Left Arm  lbs 14.5 13.9         Right Arm   lbs 14.5 13.9     Lifestyle & Dietary Hx  Pt states he feels eating and taking his supplements is a full time job now.  Pt states he plans on starting BELT in the new year.   Estimated daily fluid intake: 64 oz Estimated daily protein intake: 80 g Supplements: multi and calcium  Current average weekly physical activity: ADLs, increased his walking around work   24-Hr Dietary Recall First Meal: eggs and bacon from [employer] or chicken  Snack:  greek yogurt Second Meal: chicken Snack:  boiled egg Third Meal: wendys chili  Snack:  Beverages: water, gatorade zero, sugar free twist  Post-Op Goals/ Signs/ Symptoms Using straws: no Drinking while eating: no Chewing/swallowing difficulties: no Changes in vision: no Changes to mood/headaches: no Hair loss/changes to skin/nails: no Difficulty focusing/concentrating: no Sweating: no Limb weakness: no Dizziness/lightheadedness: no Palpitations: no  Carbonated/caffeinated beverages: no N/V/D/C/Gas: no Abdominal pain: no Dumping syndrome: no    NUTRITION DIAGNOSIS  Overweight/obesity ([place]-3.3) related to past poor dietary habits and physical inactivity as evidenced by completed bariatric surgery and following dietary guidelines for continued weight loss and healthy nutrition status.     NUTRITION INTERVENTION Nutrition counseling (C-1)  and education (E-2) to facilitate bariatric surgery goals, including: Diet advancement to the next phase (phase 4) now including non starchy vegetables  The importance of consuming adequate calories as well as certain nutrients daily due to the body's need for essential vitamins, minerals, and fats The importance of daily physical activity and to reach a goal of at least 150 minutes of moderate to vigorous physical activity weekly (or as directed by their physician) due to benefits such as increased musculature and improved lab values The importance of intuitive eating specifically learning hunger-satiety cues and understanding the importance of learning a new body: The importance of mindful eating to avoid grazing behaviors  Importance of vegetables To have an overall healthy diet, adult men and women are recommended to consume anywhere from 2-3 cups of vegetables daily. Vegetables provide a wide range of vitamins and minerals such as vitamin A, vitamin C, potassium, and folic acid. According to the [employer], including fruit and vegetables daily may reduce the risk of cardiovascular disease, certain cancers, and other non-communicable diseases.  Goals: -Continue to aim for a minimum of 64 fluid ounces 7 days a week with at least 30 ounces being plain water  -Eat non-starchy vegetables 2 times a day 7 days a week  -Start out with soft cooked vegetables today and tomorrow; if tolerated begin to eat raw vegetables or cooked including salads  -Eat your 3 ounces  of protein first then start in on your non-starchy vegetables; once you understand how much of your meal leads to satisfaction and not full while still eating 3 ounces of protein and non-starchy vegetables you can eat them in any order   -Continue to aim for 30 minutes of activity at least 5 times a week  -Do NOT cook with/add to your food: alfredo sauce, cheese sauce, barbeque sauce, ketchup, fat back, butter, bacon grease,  grease, Crisco, OR SUGAR   Handouts Provided Include  Phase 4  Learning Style & Readiness for Change Teaching method utilized: Visual & Auditory  Demonstrated degree of understanding via: Teach Back  Readiness Level: action  Barriers to learning/adherence to lifestyle change: none stated   RD's Notes for Next Visit Assess adherence to pt chosen goals    MONITORING & EVALUATION Dietary intake, weekly physical activity, body weight  Next Steps Patient is to follow-up in 3-4 months

## 2021-02-23 ENCOUNTER — Other Ambulatory Visit (HOSPITAL_COMMUNITY): Payer: Self-pay

## 2021-04-22 ENCOUNTER — Encounter: Payer: 59 | Attending: General Surgery | Admitting: Skilled Nursing Facility1

## 2021-04-22 ENCOUNTER — Other Ambulatory Visit: Payer: Self-pay

## 2021-04-22 DIAGNOSIS — E669 Obesity, unspecified: Secondary | ICD-10-CM | POA: Insufficient documentation

## 2021-04-22 NOTE — Progress Notes (Signed)
Bariatric Nutrition Follow-Up Visit ?Medical Nutrition Therapy  ? ?NUTRITION ASSESSMENT ?  ? ?Surgery date: 11/30/20 ?Surgery type: sleeve ?Start weight at [facility]: 550 pounds ?Weight today: 457 pounds ? ? ?Body Composition Scale Date 01/25/2021 04/22/2021  ?Current Body Weight 495.8 480.1 457  ?Total Body Fat % 47.1 46.5 45.6  ?Visceral Fat 58 56 52  ?Fat-Free Mass % 52.8 53.4 54.3  ? Total Body Water % 33.8 34.4 35.3  ?Muscle-Mass lbs 93.8 90.8 86.4  ?BMI 67.6 65.4 62.3  ?Body Fat Displacement     ?       Torso  lbs 145.5 139 129.7  ?       Left Leg  lbs 29.0 27.8 25.9  ?       Right Leg  lbs 29.0 27.8 25.9  ?       Left Arm  lbs 14.5 13.9 12.9  ?       Right Arm   lbs 14.5 13.9 12.9  ? ?  ?Lifestyle & Dietary Hx ? ?Pt states he did increase his overall activity but was not able to stick with BELT due to working full time and being a full time Consulting civil engineer. Pt states he goes to sleep around 3-4am. Pt states he did join [employer] and his work wife will do it with him and states he will try the aquatic center.  ?Pt states he has has been eating french fries. Pt states he has been eating out more but reading the labels and making mindful decisions due to his crazy schedule.  ?Pt states he has been cooking collards and keeping them frozen in baggies.  ?Pt states he does have an upcoming with his doctor for his BP meds.  ? ?Estimated daily fluid intake: 64 oz ?Estimated daily protein intake: 80 g ?Supplements: multi and calcium  ?Current average weekly physical activity: ADL's increased overall activity  ? ?24-Hr Dietary Recall ?First Meal: eggs and bacon from [employer] or chicken  ?Snack:  greek yogurt ?Second Meal: chicken salad or pizza (just the toppings) and spoonful potato pie or almond crackers and tuna and nuts ?Snack:  boiled egg ?Third Meal: wendys chili or cubed steak + cauliflower mashed potatoes + green beans ?Snack:  ?Beverages: water, gatorade zero, sugar free twist ? ?Post-Op Goals/ Signs/  Symptoms ?Using straws: no ?Drinking while eating: no ?Chewing/swallowing difficulties: no ?Changes in vision: no ?Changes to mood/headaches: no ?Hair loss/changes to skin/nails: no ?Difficulty focusing/concentrating: no ?Sweating: no ?Limb weakness: no ?Dizziness/lightheadedness: no ?Palpitations: no  ?Carbonated/caffeinated beverages: no ?N/V/D/C/Gas: no ?Abdominal pain: no ?Dumping syndrome: no ? ?  ?NUTRITION DIAGNOSIS  ?Overweight/obesity ([place]-3.3) related to past poor dietary habits and physical inactivity as evidenced by completed bariatric surgery and following dietary guidelines for continued weight loss and healthy nutrition status. ?  ?  ?NUTRITION INTERVENTION ?Nutrition counseling (C-1) and education (E-2) to facilitate bariatric surgery goals, including: ?The importance of consuming adequate calories as well as certain nutrients daily due to the body's need for essential vitamins, minerals, and fats ?The importance of daily physical activity and to reach a goal of at least 150 minutes of moderate to vigorous physical activity weekly (or as directed by their physician) due to benefits such as increased musculature and improved lab values ?The importance of intuitive eating specifically learning hunger-satiety cues and understanding the importance of learning a new body: The importance of mindful eating to avoid grazing behaviors  ?Importance of vegetables ?To have an overall healthy diet, adult men and women  are recommended to consume anywhere from 2-3 cups of vegetables daily. Vegetables provide a wide range of vitamins and minerals such as vitamin A, vitamin C, potassium, and folic acid. According to the [employer], including fruit and vegetables daily may reduce the risk of cardiovascular disease, certain cancers, and other non-communicable diseases. ?Be mindful of how many times throughout the week you are eating fast food ?Why you need complex carbohydrates: Whole grains and other  complex carbohydrates are required to have a healthy diet. Whole grains provide fiber which can help with blood glucose levels and help keep you satiated. Fruits and starchy vegetables provide essential vitamins and minerals required for immune function, eyesight support, brain support, bone density, wound healing and many other functions within the body. According to the current evidenced based 2020-2025 Dietary Guidelines for Americans, complex carbohydrates are part of a healthy eating pattern which is associated with a decreased risk for type 2 diabetes, cancers, and cardiovascular disease.  ? ? ? ?Handouts Provided Include  ?Phase 5 ? ?Learning Style & Readiness for Change ?Teaching method utilized: Visual & Auditory  ?Demonstrated degree of understanding via: Teach Back  ?Readiness Level: action  ?Barriers to learning/adherence to lifestyle change: none stated  ? ?RD's Notes for Next Visit ?Assess adherence to pt chosen goals  ? ? ?MONITORING & EVALUATION ?Dietary intake, weekly physical activity, body weight ? ?Next Steps ?Patient is to follow-up in 3-4 months ?

## 2021-07-21 ENCOUNTER — Ambulatory Visit: Payer: 59 | Admitting: Skilled Nursing Facility1

## 2023-07-07 ENCOUNTER — Encounter (HOSPITAL_COMMUNITY): Payer: Self-pay | Admitting: *Deleted
